# Patient Record
Sex: Female | Born: 1965 | Race: White | Hispanic: No | Marital: Married | Smoking: Current every day smoker
Health system: Southern US, Community
[De-identification: ages and names within clinical notes are randomized; demographics above are authoritative.]

---

## 2014-01-31 ENCOUNTER — Ambulatory Visit (INDEPENDENT_AMBULATORY_CARE_PROVIDER_SITE_OTHER): Payer: BC Managed Care – PPO | Admitting: Sports Medicine

## 2014-01-31 ENCOUNTER — Encounter: Payer: Self-pay | Admitting: Sports Medicine

## 2014-01-31 VITALS — BP 138/84 | HR 99 | Ht 66.0 in | Wt 245.0 lb

## 2014-01-31 DIAGNOSIS — G4719 Other hypersomnia: Secondary | ICD-10-CM | POA: Diagnosis not present

## 2014-01-31 DIAGNOSIS — E669 Obesity, unspecified: Secondary | ICD-10-CM | POA: Diagnosis not present

## 2014-01-31 DIAGNOSIS — E039 Hypothyroidism, unspecified: Secondary | ICD-10-CM | POA: Insufficient documentation

## 2014-01-31 DIAGNOSIS — Z0000 Encounter for general adult medical examination without abnormal findings: Secondary | ICD-10-CM | POA: Diagnosis not present

## 2014-01-31 DIAGNOSIS — H6122 Impacted cerumen, left ear: Secondary | ICD-10-CM | POA: Diagnosis not present

## 2014-01-31 DIAGNOSIS — E031 Congenital hypothyroidism without goiter: Secondary | ICD-10-CM | POA: Diagnosis not present

## 2014-01-31 MED ORDER — PHENTERMINE HCL 37.5 MG PO CAPS
ORAL_CAPSULE | ORAL | Status: DC
Start: 2014-01-31 — End: 2014-03-18

## 2014-01-31 NOTE — Assessment & Plan Note (Signed)
Phentermine, return monthly for weight checks and refills. 

## 2014-01-31 NOTE — Assessment & Plan Note (Signed)
Pap smear was earlier last year. Good for another 4 years.

## 2014-01-31 NOTE — Assessment & Plan Note (Signed)
Continue Cytomel and levothyroxine. If TSH, T3, T4 are in the normal range we will need to pursue a different diagnosis for her excessive daytime sleepiness.

## 2014-01-31 NOTE — Assessment & Plan Note (Signed)
We will first make sure her thyroid studies are within the normal range, if normal we need to look into sleep apnea.

## 2014-01-31 NOTE — Progress Notes (Addendum)
  Subjective:    CC: Establish care.   HPI:  This is a very pleasant 48 year old female, she presents for assistance with weight management, she has tried low-dose phentermine in the past, as well as multiple diets but is unable to cope with a voracious appetite. She has realistic expectations.  Hypothyroidism: Persistent excessive daytime sleepiness, she has been treated with Cytomel and levothyroxine, it sounds as though she had a sleep study in the distant past that was negative.  Excessive daytime sleepiness: Persistent excessive daytime sleepiness, she has been treated with Cytomel and levothyroxine, it sounds as though she had a sleep study in the distant past that was negative.  Past medical history, Surgical history, Family history not pertinant except as noted below, Social history, Allergies, and medications have been entered into the medical record, reviewed, and no changes needed.   Review of Systems: No headache, visual changes, nausea, vomiting, diarrhea, constipation, dizziness, abdominal pain, skin rash, fevers, chills, night sweats, swollen lymph nodes, weight loss, chest pain, body aches, joint swelling, muscle aches, shortness of breath, mood changes, visual or auditory hallucinations.  Objective:    General: Well Developed, well nourished, and in no acute distress.  Neuro: Alert and oriented x3, extra-ocular muscles intact, sensation grossly intact.  HEENT: Normocephalic, atraumatic, pupils equal round reactive to light, neck supple, no masses, no lymphadenopathy, thyroid nonpalpable. Impacted cerumen in the left canal. Skin: Warm and dry, no rashes noted.  Cardiac: Regular rate and rhythm, no murmurs rubs or gallops.  Respiratory: Clear to auscultation bilaterally. Not using accessory muscles, speaking in full sentences.  Abdominal: Soft, nontender, nondistended, positive bowel sounds, no masses, no organomegaly.  Musculoskeletal: Shoulder, elbow, wrist, hip, knee, ankle  stable, and with full range of motion.  Indication: Cerumen impaction of the ear(s) Medical necessity statement: On physical examination, cerumen impairs clinically significant portions of the external auditory canal, and tympanic membrane. Noted obstructive, copious cerumen that cannot be removed without magnification and instrumentations requiring physician skills Consent: Discussed benefits and risks of procedure and verbal consent obtained Procedure: Patient was prepped for the procedure. Utilized an otoscope to assess and take note of the ear canal, the tympanic membrane, and the presence, amount, and placement of the cerumen. Gentle water irrigation and soft plastic curette was utilized to remove cerumen.  Post procedure examination: shows cerumen was completely removed. Patient tolerated procedure well. The patient is made aware that they may experience temporary vertigo, temporary hearing loss, and temporary discomfort. If these symptom last for more than 24 hours to call the clinic or proceed to the ED.  Impression and Recommendations:    The patient was counselled, risk factors were discussed, anticipatory guidance given.

## 2014-02-07 LAB — COMPREHENSIVE METABOLIC PANEL WITH GFR
Albumin: 3.7 g/dL (ref 3.5–5.2)
BUN: 8 mg/dL (ref 6–23)
Chloride: 105 meq/L (ref 96–112)
Creat: 0.63 mg/dL (ref 0.50–1.10)
Glucose, Bld: 98 mg/dL (ref 70–99)
Potassium: 4.3 meq/L (ref 3.5–5.3)

## 2014-02-07 LAB — CBC
HCT: 36.2 % (ref 36.0–46.0)
Hemoglobin: 11.8 g/dL — ABNORMAL LOW (ref 12.0–15.0)
MCH: 28.1 pg (ref 26.0–34.0)
MCHC: 32.6 g/dL (ref 30.0–36.0)
MCV: 86.2 fL (ref 78.0–100.0)
Platelets: 382 10*3/uL (ref 150–400)
RBC: 4.2 MIL/uL (ref 3.87–5.11)
RDW: 14.6 % (ref 11.5–15.5)
WBC: 7.5 K/uL (ref 4.0–10.5)

## 2014-02-07 LAB — TSH: TSH: 1.277 u[IU]/mL (ref 0.350–4.500)

## 2014-02-07 LAB — LIPID PANEL
Cholesterol: 186 mg/dL (ref 0–200)
HDL: 55 mg/dL (ref >39)
LDL Cholesterol: 115 mg/dL — ABNORMAL HIGH (ref 0–99)
Total CHOL/HDL Ratio: 3.4 Ratio
Triglycerides: 82 mg/dL (ref <150)
VLDL: 16 mg/dL (ref 0–40)

## 2014-02-07 LAB — COMPREHENSIVE METABOLIC PANEL
ALT: 16 U/L (ref 0–35)
AST: 13 U/L (ref 0–37)
Alkaline Phosphatase: 101 U/L (ref 39–117)
CO2: 25 mEq/L (ref 19–32)
Calcium: 9.1 mg/dL (ref 8.4–10.5)
Sodium: 141 mEq/L (ref 135–145)
Total Bilirubin: 0.2 mg/dL (ref 0.2–1.2)
Total Protein: 6.5 g/dL (ref 6.0–8.3)

## 2014-02-07 LAB — T3, FREE: T3, Free: 2.6 pg/mL (ref 2.3–4.2)

## 2014-02-07 LAB — T4, FREE: Free T4: 1.18 ng/dL (ref 0.80–1.80)

## 2014-02-10 ENCOUNTER — Encounter: Payer: Self-pay | Admitting: Sports Medicine

## 2014-03-03 ENCOUNTER — Other Ambulatory Visit: Payer: Self-pay | Admitting: Sports Medicine

## 2014-03-03 ENCOUNTER — Ambulatory Visit: Payer: BC Managed Care – PPO | Admitting: Sports Medicine

## 2014-03-04 ENCOUNTER — Ambulatory Visit: Payer: BC Managed Care – PPO | Admitting: Sports Medicine

## 2014-03-10 ENCOUNTER — Ambulatory Visit: Payer: BC Managed Care – PPO | Admitting: Sports Medicine

## 2014-03-17 ENCOUNTER — Ambulatory Visit: Payer: BC Managed Care – PPO | Admitting: Sports Medicine

## 2014-03-18 ENCOUNTER — Ambulatory Visit (INDEPENDENT_AMBULATORY_CARE_PROVIDER_SITE_OTHER): Payer: BC Managed Care – PPO | Admitting: Sports Medicine

## 2014-03-18 ENCOUNTER — Encounter: Payer: Self-pay | Admitting: Sports Medicine

## 2014-03-18 VITALS — BP 133/81 | HR 99 | Ht 66.0 in | Wt 246.0 lb

## 2014-03-18 DIAGNOSIS — E031 Congenital hypothyroidism without goiter: Secondary | ICD-10-CM

## 2014-03-18 DIAGNOSIS — E669 Obesity, unspecified: Secondary | ICD-10-CM

## 2014-03-18 DIAGNOSIS — G4719 Other hypersomnia: Secondary | ICD-10-CM

## 2014-03-18 MED ORDER — NALTREXONE-BUPROPION HCL ER 8-90 MG PO TB12: ORAL_TABLET | ORAL | Status: DC

## 2014-03-18 NOTE — Assessment & Plan Note (Addendum)
Insufficient weight loss on phentermine. Nutrition referral. Switching to Contrave.

## 2014-03-18 NOTE — Assessment & Plan Note (Signed)
Well-controlled on current medications 

## 2014-03-18 NOTE — Progress Notes (Signed)
  Subjective:    CC: Follow-up  HPI: Obesity: Missed 2 weeks of medication, has not lost any weight on phentermine.  Fatigue/daytime sleepiness: TSH, T3, T4 are normal, she denies any symptoms of depression. She has had a sleep study which was negative. She is perimenopausal with periods becoming irregular now.  Past medical history, Surgical history, Family history not pertinant except as noted below, Social history, Allergies, and medications have been entered into the medical record, reviewed, and no changes needed.   Review of Systems: No fevers, chills, night sweats, weight loss, chest pain, or shortness of breath.   Objective:    General: Well Developed, well nourished, and in no acute distress.  Neuro: Alert and oriented x3, extra-ocular muscles intact, sensation grossly intact.  HEENT: Normocephalic, atraumatic, pupils equal round reactive to light, neck supple, no masses, no lymphadenopathy, thyroid nonpalpable.  Skin: Warm and dry, no rashes. Cardiac: Regular rate and rhythm, no murmurs rubs or gallops, no lower extremity edema.  Respiratory: Clear to auscultation bilaterally. Not using accessory muscles, speaking in full sentences.  Impression and Recommendations:

## 2014-03-18 NOTE — Assessment & Plan Note (Addendum)
Sleeps well through the night. This point away from sleep apnea. She's also had sleep studies in the past that were negative. Thyroid function is normal pointing away from hypothyroidism. Mood is okay. I hope that we get some improvement with adding Contrave for weight loss. If not certainly we can consider Provigil or Nuvigil. Last resort, would be referral to a sleep specialist.  We are going to take FSH, LH, progesterone, and estrogen levels today.  I do suspect there is an element of perimenopause.

## 2014-03-19 LAB — FOLLICLE STIMULATING HORMONE: FSH: 9.3 m[IU]/mL

## 2014-03-19 LAB — PROLACTIN: Prolactin: 6.4 ng/mL

## 2014-03-19 LAB — LUTEINIZING HORMONE: LH: 6.4 m[IU]/mL

## 2014-03-19 LAB — PROGESTERONE: Progesterone: 0.4 ng/mL

## 2014-03-27 LAB — ESTROGENS, TOTAL: Estrogen: 290 pg/mL

## 2014-04-01 ENCOUNTER — Encounter: Payer: Self-pay | Admitting: Sports Medicine

## 2014-04-01 DIAGNOSIS — E669 Obesity, unspecified: Secondary | ICD-10-CM

## 2014-04-02 MED ORDER — PHENTERMINE HCL 37.5 MG PO CAPS: ORAL_CAPSULE | ORAL | Status: DC

## 2014-04-02 MED ORDER — TOPIRAMATE 50 MG PO TABS: ORAL_TABLET | ORAL | Status: DC

## 2014-04-02 NOTE — Assessment & Plan Note (Signed)
Unable to pickup, Contrave. Per patient request we will restart phentermine with the addition of Topamax for a one-month trial.

## 2014-04-03 ENCOUNTER — Other Ambulatory Visit: Payer: Self-pay

## 2014-04-03 MED ORDER — PHENTERMINE HCL 37.5 MG PO CAPS: ORAL_CAPSULE | ORAL | Status: DC

## 2014-04-03 NOTE — Telephone Encounter (Signed)
Rx was reprinted because the other Rx was not found. Rhonda Cunningham,CMA

## 2014-04-21 ENCOUNTER — Ambulatory Visit: Payer: BC Managed Care – PPO | Admitting: Sports Medicine

## 2014-05-02 ENCOUNTER — Encounter: Payer: Self-pay | Admitting: Sports Medicine

## 2014-05-02 ENCOUNTER — Ambulatory Visit (INDEPENDENT_AMBULATORY_CARE_PROVIDER_SITE_OTHER): Payer: Self-pay | Admitting: Sports Medicine

## 2014-05-02 DIAGNOSIS — E669 Obesity, unspecified: Secondary | ICD-10-CM

## 2014-05-02 MED ORDER — NYSTATIN 100000 UNIT/ML MT SUSP: 500000.0000 [IU] | Freq: Four times a day (QID) | OROMUCOSAL | Status: DC

## 2014-05-02 MED ORDER — PHENTERMINE HCL 37.5 MG PO CAPS: ORAL_CAPSULE | ORAL | Status: DC

## 2014-05-02 MED ORDER — TOPIRAMATE 100 MG PO TABS: 100.0000 mg | ORAL_TABLET | Freq: Every day | ORAL | Status: DC

## 2014-05-02 NOTE — Progress Notes (Signed)
  Subjective:    CC: weight check  HPI: Obesity: Jenny Jennings did finally lose some weight, we are giving her a second chance on phentermine, and added Topamax this time.  Past medical history, Surgical history, Family history not pertinant except as noted below, Social history, Allergies, and medications have been entered into the medical record, reviewed, and no changes needed.   Review of Systems: No fevers, chills, night sweats, weight loss, chest pain, or shortness of breath.   Objective:    General: Well Developed, well nourished, and in no acute distress.  Neuro: Alert and oriented x3, extra-ocular muscles intact, sensation grossly intact.  HEENT: Normocephalic, atraumatic, pupils equal round reactive to light, neck supple, no masses, no lymphadenopathy, thyroid nonpalpable.  Skin: Warm and dry, no rashes. Cardiac: Regular rate and rhythm, no murmurs rubs or gallops, no lower extremity edema.  Respiratory: Clear to auscultation bilaterally. Not using accessory muscles, speaking in full sentences.  Impression and Recommendations:

## 2014-05-02 NOTE — Assessment & Plan Note (Addendum)
5 pound weight loss on phentermine and Topamax. Next line refilling medication. She does have some burning in her mouth is likely due to dry mouth.  she is also seeing a nutritionist. She is concerned that there is thrush however I don't see anything, I'm going to give her a nystatin gargle for now. She will touch base with her primary doctor regarding this.

## 2014-05-30 ENCOUNTER — Encounter: Payer: Self-pay | Admitting: Sports Medicine

## 2014-05-30 ENCOUNTER — Ambulatory Visit: Payer: BLUE CROSS/BLUE SHIELD | Admitting: Sports Medicine

## 2017-12-12 ENCOUNTER — Encounter (HOSPITAL_COMMUNITY): Payer: Self-pay | Admitting: Emergency Medicine

## 2017-12-12 ENCOUNTER — Emergency Department (HOSPITAL_COMMUNITY)
Admission: EM | Admit: 2017-12-12 | Discharge: 2017-12-13 | Disposition: A | Discharge status: Other Acute Inpatient Hospital | Payer: BLUE CROSS/BLUE SHIELD | Attending: Emergency Medicine | Admitting: Emergency Medicine

## 2017-12-12 DIAGNOSIS — Z720 Tobacco use: Secondary | ICD-10-CM | POA: Insufficient documentation

## 2017-12-12 DIAGNOSIS — F329 Major depressive disorder, single episode, unspecified: Secondary | ICD-10-CM | POA: Insufficient documentation

## 2017-12-12 DIAGNOSIS — F411 Generalized anxiety disorder: Secondary | ICD-10-CM | POA: Insufficient documentation

## 2017-12-12 DIAGNOSIS — F419 Anxiety disorder, unspecified: Secondary | ICD-10-CM | POA: Insufficient documentation

## 2017-12-12 DIAGNOSIS — E039 Hypothyroidism, unspecified: Secondary | ICD-10-CM | POA: Insufficient documentation

## 2017-12-12 DIAGNOSIS — R4689 Other symptoms and signs involving appearance and behavior: Secondary | ICD-10-CM

## 2017-12-12 LAB — CBC
HCT: 38.8 % (ref 36.0–46.0)
Hemoglobin: 12.7 g/dL (ref 12.0–15.0)
MCH: 28.6 pg (ref 26.0–34.0)
MCHC: 32.7 g/dL (ref 30.0–36.0)
MCV: 87.4 fL (ref 78.0–100.0)
PLATELETS: 371 10*3/uL (ref 150–400)
RBC: 4.44 MIL/uL (ref 3.87–5.11)
RDW: 13.6 % (ref 11.5–15.5)
WBC: 11.3 10*3/uL — AB (ref 4.0–10.5)

## 2017-12-12 LAB — SALICYLATE LEVEL: Salicylate Lvl: <7 mg/dL (ref 2.8–30.0)

## 2017-12-12 LAB — COMPREHENSIVE METABOLIC PANEL
ALBUMIN: 3.9 g/dL (ref 3.5–5.0)
ALT: 53 U/L — ABNORMAL HIGH (ref 0–44)
AST: 36 U/L (ref 15–41)
Alkaline Phosphatase: 94 U/L (ref 38–126)
Anion gap: 12 (ref 5–15)
BUN: 5 mg/dL — AB (ref 6–20)
CHLORIDE: 106 mmol/L (ref 98–111)
CO2: 21 mmol/L — ABNORMAL LOW (ref 22–32)
Calcium: 9.4 mg/dL (ref 8.9–10.3)
Creatinine, Ser: 0.83 mg/dL (ref 0.44–1.00)
GFR calc Af Amer: >60 mL/min (ref >60)
Glucose, Bld: 121 mg/dL — ABNORMAL HIGH (ref 70–99)
POTASSIUM: 3.1 mmol/L — AB (ref 3.5–5.1)
Sodium: 139 mmol/L (ref 135–145)
Total Bilirubin: 0.5 mg/dL (ref 0.3–1.2)
Total Protein: 6.9 g/dL (ref 6.5–8.1)

## 2017-12-12 LAB — ACETAMINOPHEN LEVEL: Acetaminophen (Tylenol), Serum: <10 ug/mL — ABNORMAL LOW (ref 10–30)

## 2017-12-12 LAB — ETHANOL

## 2017-12-12 NOTE — ED Triage Notes (Signed)
Pt presents with family members stating she has been altered X few days. Per family pt has not ate, drank, or slept. Pt appears to be manic, has flight of ideas. Per family no hx of bipolar disorder, only as hx of anxiety and depression.

## 2017-12-12 NOTE — ED Provider Notes (Signed)
[facility] [place] EMERGENCY DEPARTMENT Provider Note   CSN: [id number] Arrival date & time: 12/12/17  2204     History   Chief Complaint Chief Complaint  Patient presents with  . Altered Mental Status    HPI Jenny Jennings is a 52 y.o. female.  The history is provided by the patient and medical records.  Altered Mental Status       52 y.o. F with hx of obesity, hypothyroidism, presenting to the ED for acute behavioral changes.  Family reports they first started noticing this on Saturday, has steadily worsened since that time.  Family reports since Saturday she has not really ate, drank fluids, or slept more than 30 minutes at a time.  States she has been up pacing around the house, constantly fidgeting, and has had a very labile mood.  States she has been somewhat verbally aggressive at times, other times laughing inappropriately.  She has not had any focal numbness or weakness, no difficulty walking, no slurred speech.  No real medical concerns, had a full physical over the summer and everything seemed to be going normal.  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  Husband reports she seemed to be under a lot of stress about this, primary care doctor did recently increase her anxiety medicine to accommodate for this.  There have been no other changes in medications.  She has not had any fevers or other systemic symptoms.  Husband denies any use of alcohol or illicit drugs.  History reviewed. No pertinent past medical history.  Patient Active Problem List   Diagnosis Date Noted  . Obesity 01/31/2014  . Excessive daytime sleepiness 01/31/2014  . Hypothyroidism 01/31/2014  . Annual physical exam 01/31/2014    History reviewed. No pertinent surgical history.   OB History   None      Home Medications    Prior to  Admission medications   Medication Sig Start Date End Date Taking? Authorizing Provider  levothyroxine (SYNTHROID, LEVOTHROID) 137 MCG tablet Take 137 mcg by mouth daily before breakfast.    [provider]  liothyronine (CYTOMEL) 25 MCG tablet Take by mouth daily.    [provider]  nystatin (MYCOSTATIN) 100000 UNIT/ML suspension Take 5 mLs (500,000 Units total) by mouth 4 (four) times daily. Swish for 30 seconds and spit out. 05/02/14   Monica Becton, MD  phentermine 37.5 MG capsule One capsule by mouth at 11 AM 05/02/14   Monica Becton, MD  pregabalin (LYRICA) 75 MG capsule Take 75 mg by mouth 2 (two) times daily.    [provider]  topiramate (TOPAMAX) 100 MG tablet Take 1 tablet (100 mg total) by mouth daily. 05/02/14   Monica Becton, MD    Family History No family history on file.  Social History Social History   Tobacco Use  . Smoking status: Smoker, Current Status Unknown  . Smokeless tobacco: Never Used  Substance Use Topics  . Alcohol use: Not Currently  . Drug use: Not Currently     Allergies   Patient has no known allergies.   Review of Systems Review of Systems  Psychiatric/Behavioral: Positive for behavioral problems.  All other systems reviewed and are negative.    Physical Exam Updated Vital Signs BP 103/80 (BP Location: Right Arm)   Pulse 72   Temp 98.3 F (36.8 C) (  Oral)   Resp 18   SpO2 98%   Physical Exam  Constitutional: She appears well-developed and well-nourished.  HENT:  Head: Normocephalic and atraumatic.  Mouth/Throat: Oropharynx is clear and moist.  Eyes: Pupils are equal, round, and reactive to light. Conjunctivae and EOM are normal.  Neck: Normal range of motion.  Cardiovascular: Normal rate, regular rhythm and normal heart sounds.  Pulmonary/Chest: Effort normal and breath sounds normal. No stridor. No respiratory distress.  Abdominal: Soft. Bowel sounds are normal. There is no  tenderness. There is no rebound.  Musculoskeletal: Normal range of motion.  Neurological: She is alert.  Awake, alert, moving arms and legs well without issue, ambulatory with steady gait  Skin: Skin is warm and dry.  Psychiatric:  Somewhat bizarre behavior, laughing inappropriately at times, randomly yelling words with some tangential thoughts during exam  Nursing note and vitals reviewed.    ED Treatments / Results  Labs (all labs ordered are listed, but only abnormal results are displayed) Labs Reviewed  COMPREHENSIVE METABOLIC PANEL - Abnormal; Notable for the following components:      Result Value   Potassium 3.1 (*)    CO2 21 (*)    Glucose, Bld 121 (*)    BUN 5 (*)    ALT 53 (*)    All other components within normal limits  ACETAMINOPHEN LEVEL - Abnormal; Notable for the following components:   Acetaminophen (Tylenol), Serum <10 (*)    All other components within normal limits  CBC - Abnormal; Notable for the following components:   WBC 11.3 (*)    All other components within normal limits  RAPID URINE DRUG SCREEN, HOSP PERFORMED - Abnormal; Notable for the following components:   Benzodiazepines POSITIVE (*)    Amphetamines POSITIVE (*)    Tetrahydrocannabinol POSITIVE (*)    All other components within normal limits  ETHANOL  SALICYLATE LEVEL  I-STAT BETA HCG BLOOD, ED ([facility], [facility], [facility] ONLY)  POC URINE PREG, ED    EKG None  Radiology No results found.  Procedures Procedures (including critical care time)  Medications Ordered in ED Medications - No data to display   Initial Impression / Assessment and Plan / ED Course  I have reviewed the triage vital signs and the nursing notes.  Pertinent labs & imaging results that were available during my care of the patient were reviewed by me and considered in my medical decision making (see chart for details).  52 year old now brought in by family for behavioral changes.  Recently found out her mother was diagnosed  with Alzheimer's and patient is having to quit her job, has had a steady decline since then-- not sleeping or eating, up pacing all night, some outbursts witnessed.  On exam here her behavior is somewhat bizarre, laughing inappropriately during conversation, intermittently yelling words, thoughts are somewhat tangential.  Does have history of anxiety and depression, recent adjustments to some of her psychiatric medications.  Screening labs are overall reassuring.  She does not have any fever or other systemic symptoms of illness.  She does not have any focal neurologic deficits that I can appreciate on exam.  I do suspect that this is likely psychiatric in origin.  TTS consult ordered.  TTS has evaluated, recommends inpatient placement.  Have attempted to contact patient's pharmacy to order home meds, however not open right now and family unsure of dosages.  Pharmacy will be contacted in the morning and will update medication list when available.  Final Clinical Impressions(s) /  ED Diagnoses   Final diagnoses:  Behavioral change    ED Discharge Orders    None       Garlon Hatchet, PA-C 12/13/17 0601    Lorre Nick, MD 12/13/17 [phone]

## 2017-12-13 ENCOUNTER — Encounter (HOSPITAL_COMMUNITY): Payer: Self-pay | Admitting: Emergency Medicine

## 2017-12-13 ENCOUNTER — Emergency Department (HOSPITAL_COMMUNITY): Payer: BLUE CROSS/BLUE SHIELD

## 2017-12-13 ENCOUNTER — Inpatient Hospital Stay (HOSPITAL_COMMUNITY)
Admission: AD | Admit: 2017-12-13 | Discharge: 2017-12-18 | DRG: 885 | Disposition: A | Discharge status: Home / Self Care | Payer: BLUE CROSS/BLUE SHIELD | Source: Intra-hospital | Attending: Psychiatry | Admitting: Psychiatry

## 2017-12-13 DIAGNOSIS — F302 Manic episode, severe with psychotic symptoms: Principal | ICD-10-CM | POA: Diagnosis present

## 2017-12-13 DIAGNOSIS — G4700 Insomnia, unspecified: Secondary | ICD-10-CM | POA: Diagnosis present

## 2017-12-13 DIAGNOSIS — F22 Delusional disorders: Secondary | ICD-10-CM | POA: Diagnosis present

## 2017-12-13 DIAGNOSIS — M797 Fibromyalgia: Secondary | ICD-10-CM | POA: Diagnosis present

## 2017-12-13 DIAGNOSIS — F411 Generalized anxiety disorder: Secondary | ICD-10-CM | POA: Diagnosis not present

## 2017-12-13 DIAGNOSIS — Z6833 Body mass index (BMI) 33.0-33.9, adult: Secondary | ICD-10-CM

## 2017-12-13 DIAGNOSIS — F17200 Nicotine dependence, unspecified, uncomplicated: Secondary | ICD-10-CM | POA: Diagnosis present

## 2017-12-13 DIAGNOSIS — F909 Attention-deficit hyperactivity disorder, unspecified type: Secondary | ICD-10-CM | POA: Diagnosis present

## 2017-12-13 DIAGNOSIS — E039 Hypothyroidism, unspecified: Secondary | ICD-10-CM | POA: Diagnosis present

## 2017-12-13 DIAGNOSIS — F419 Anxiety disorder, unspecified: Secondary | ICD-10-CM | POA: Diagnosis present

## 2017-12-13 DIAGNOSIS — F1290 Cannabis use, unspecified, uncomplicated: Secondary | ICD-10-CM | POA: Diagnosis not present

## 2017-12-13 DIAGNOSIS — R451 Restlessness and agitation: Secondary | ICD-10-CM | POA: Diagnosis not present

## 2017-12-13 DIAGNOSIS — F29 Unspecified psychosis not due to a substance or known physiological condition: Secondary | ICD-10-CM | POA: Diagnosis present

## 2017-12-13 DIAGNOSIS — F17210 Nicotine dependence, cigarettes, uncomplicated: Secondary | ICD-10-CM | POA: Diagnosis not present

## 2017-12-13 DIAGNOSIS — E669 Obesity, unspecified: Secondary | ICD-10-CM | POA: Diagnosis present

## 2017-12-13 LAB — RAPID URINE DRUG SCREEN, HOSP PERFORMED
AMPHETAMINES: POSITIVE — AB
BENZODIAZEPINES: POSITIVE — AB
Barbiturates: NOT DETECTED
COCAINE: NOT DETECTED
OPIATES: NOT DETECTED
TETRAHYDROCANNABINOL: POSITIVE — AB

## 2017-12-13 LAB — POC URINE PREG, ED: Preg Test, Ur: NEGATIVE

## 2017-12-13 MED ORDER — ALUM & MAG HYDROXIDE-SIMETH 200-200-20 MG/5ML PO SUSP: 30.0000 mL | Freq: Four times a day (QID) | ORAL | Status: DC | PRN

## 2017-12-13 MED ORDER — LORAZEPAM 1 MG PO TABS
1.0000 mg | ORAL_TABLET | Freq: Once | ORAL | Status: AC
  Administered 2017-12-13: 1 mg via ORAL
  Filled 2017-12-13: qty 1

## 2017-12-13 MED ORDER — FLUOXETINE HCL 20 MG PO CAPS: 40.0000 mg | ORAL_CAPSULE | Freq: Every day | ORAL | Status: DC

## 2017-12-13 MED ORDER — THYROID 60 MG PO TABS
65.0000 mg | ORAL_TABLET | Freq: Every day | ORAL | Status: DC
  Administered 2017-12-13: 60 mg via ORAL
  Filled 2017-12-13: qty 1

## 2017-12-13 MED ORDER — ONDANSETRON HCL 4 MG PO TABS: 4.0000 mg | ORAL_TABLET | Freq: Three times a day (TID) | ORAL | Status: DC | PRN

## 2017-12-13 MED ORDER — OLANZAPINE 5 MG PO TBDP
5.0000 mg | ORAL_TABLET | Freq: Once | ORAL | Status: AC
  Administered 2017-12-13: 5 mg via ORAL
  Filled 2017-12-13: qty 1

## 2017-12-13 MED ORDER — ZOLPIDEM TARTRATE 5 MG PO TABS: 5.0000 mg | ORAL_TABLET | Freq: Every evening | ORAL | Status: DC | PRN

## 2017-12-13 MED ORDER — ZIPRASIDONE MESYLATE 20 MG IM SOLR
20.0000 mg | Freq: Once | INTRAMUSCULAR | Status: AC
  Administered 2017-12-13: 20 mg via INTRAMUSCULAR
  Filled 2017-12-13: qty 20

## 2017-12-13 MED ORDER — LISDEXAMFETAMINE DIMESYLATE 20 MG PO CAPS: 40.0000 mg | ORAL_CAPSULE | Freq: Every day | ORAL | Status: DC

## 2017-12-13 MED ORDER — STERILE WATER FOR INJECTION IJ SOLN
INTRAMUSCULAR | Status: AC
  Filled 2017-12-13: qty 10

## 2017-12-13 MED ORDER — IBUPROFEN 800 MG PO TABS: 800.0000 mg | ORAL_TABLET | Freq: Four times a day (QID) | ORAL | Status: DC | PRN

## 2017-12-13 MED ORDER — ACETAMINOPHEN 325 MG PO TABS: 650.0000 mg | ORAL_TABLET | ORAL | Status: DC | PRN

## 2017-12-13 MED ORDER — DIPHENHYDRAMINE HCL 50 MG/ML IJ SOLN
50.0000 mg | Freq: Once | INTRAMUSCULAR | Status: AC
  Administered 2017-12-13: 50 mg via INTRAMUSCULAR
  Filled 2017-12-13: qty 1

## 2017-12-13 NOTE — BH Assessment (Signed)
Tele Assessment Note   Patient Name: Jenny Jennings MRN: [id number] Referring Physician: Sharilyn Sites, PA-C Location of Patient: [facility] Location of Provider: Behavioral Health TTS Department  Jenny Jennings is an 52 y.o. female who was brought to [facility] by her husband and her friend due to ongoing strange behaviors. Pt's husband and friend provided the information for this assessment, as pt appeared to be experiencing a psychotic break and was not engaging in conversation, only saying words and making statements at times. Pt's husband shares pt began acting "off" Saturday evening; he states this got worse on Sunday and still worse on Monday, and when it did not get better today he brought her to the hospital. Pt's husband shares pt has been under much stress, including a large increase in stress due to her mother having Alzheimer's and pt leaving her job to help take care of her parents.  Pt's husband denies pt uses drugs. He states pt has been stressed before but she has never had this type of incident. He states he does not believe pt has slept since Saturday, with the exception of 20 minute intervals "here and there." Pt's husband states pt has been prescribed Xanax for her anxiety and that she also takes medication to assist with her depression, though he cannot remember what she takes. Pt's friend stated she and pt's husband went through pt's medication and reviewed the pill bottles to determine when they were filled and how much medication should be left in each of them; she states that, according to what they found, the amount of pills in each bottle appeared to be correct. Pt's husband denies any previous hospitalizations or suicide attempts.  Pt was engaged in EMDR therapy for 4-6 months with Jenny Jennings 8-10 months ago. She is not currently seeing a therapist nor has she ever seen a psychiatrist. Pt's husband states that, when pt is depressed, she is irritable and tearful. Pt's husband  states he hadn't noticed anything different about pt's sleep prior to Saturday and that had also been eating regularly. Pt was adopted, so some information about her biological family is unknown.  Pt's orientation was unable to be assessed, as was her memory. Pt's husband and her friend state pt can follow directions, though she cannot actively communicate. Pt did not participate in this assessment. Pt's insight, judgement, and impulse control was not able to be determined.   Diagnosis: F41.1, Generalized anxiety disorder   Past Medical History: History reviewed. No pertinent past medical history.  History reviewed. No pertinent surgical history.  Family History: No family history on file.  Social History:  reports that she has been smoking. She has never used smokeless tobacco. She reports that she drank alcohol. She reports that she has current or past drug history.  Additional Social History:  Alcohol / Drug Use Pain Medications: Please see MAR Prescriptions: Please see MAR Over the Counter: Please see MAR History of alcohol / drug use?: No history of alcohol / drug abuse Longest period of sobriety (when/how long): Please see MAR  CIWA: CIWA-Ar BP: 103/80 Pulse Rate: 72 COWS:    Allergies: No Known Allergies  Home Medications:  (Not in a hospital admission)  OB/GYN Status:  No LMP recorded.  General Assessment Data Location of Assessment: [facility] ED TTS Assessment: In system Is this a Tele or Face-to-Face Assessment?: Face-to-Face Is this an Initial Assessment or a Re-assessment for this encounter?: Initial Assessment Patient Accompanied by:: Other(Pt is accompanied by her husband and her  co-worker friend) Counsellor Other than English: No Living Arrangements: Other (Comment)(Pt lives with her husband and children) What gender do you identify as?: Female Marital status: Married Artist name: Jenny Jennings Pregnancy Status: No Living Arrangements: Spouse/significant other,  Children Can pt return to current living arrangement?: Yes Admission Status: Voluntary Is patient capable of signing voluntary admission?: No Referral Source: Self/Family/Friend Insurance type: BCBS     Crisis Care Plan Living Arrangements: Spouse/significant other, Children Name of Psychiatrist: None Name of Therapist: None  Education Status Is patient currently in school?: No Is the patient employed, unemployed or receiving disability?: Employed  Risk to self with the past 6 months Suicidal Ideation: (UTA) Has patient been a risk to self within the past 6 months prior to admission? : (UTA) Suicidal Intent: (UTA) Has patient had any suicidal intent within the past 6 months prior to admission? : (UTA) Is patient at risk for suicide?: (UTA) Suicidal Plan?: (UTA) Has patient had any suicidal plan within the past 6 months prior to admission? : (UTA) Access to Means: (UTA) What has been your use of drugs/alcohol within the last 12 months?: Pt's husband denies; pt's UDA negative Previous Attempts/Gestures: No How many times?: 0 Other Self Harm Risks: None noted Triggers for Past Attempts: None known Intentional Self Injurious Behavior: None(None noted by pt's husband) Family Suicide History: Unable to assess(Pt's husband shared pt is adopted) Recent stressful life event(s): Loss (Comment), Turmoil (Comment)(Pt is leaving her job, her mother has Alzheimer's) Persecutory voices/beliefs?: [name]) Depression: (UTA) Depression Symptoms: (UTA) Substance abuse history and/or treatment for substance abuse?: No Suicide prevention information given to non-admitted patients: Not applicable  Risk to Others within the past 6 months Homicidal Ideation: (UTA) Does patient have any lifetime risk of violence toward others beyond the six months prior to admission? : No Thoughts of Harm to Others: (UTA) Current Homicidal Intent: (UTA) Current Homicidal Plan: (UTA) Access to Homicidal Means:  (UTA) Identified Victim: None noted, though pt is essentially non-verbal/word salad History of harm to others?: No Assessment of Violence: On admission Violent Behavior Description: None noted by pt's husband Does patient have access to weapons?: (UTA) Criminal Charges Pending?: No Does patient have a court date: No Is patient on probation?: No  Psychosis Hallucinations: (UTA) Delusions: (UTA)  Mental Status Report Appearance/Hygiene: In scrubs Eye Contact: Poor Motor Activity: Mannerisms, Restlessness, Hyperactivity Speech: Word salad, Tangential(Pt is experiencing a psychotic break) Level of Consciousness: Quiet/awake, Restless, Other (Comment)(Pt is experiencing a psychotic break) Mood: Anxious, Sullen, Fearful(Pt is experiencing a psychotic break) Affect: Apprehensive(Pt is experiencing a psychotic break) Anxiety Level: Moderate Thought Processes: Unable to Assess(Pt is experiencing a psychotic break) Judgement: Unable to Assess(Pt is experiencing a psychotic break) Orientation: Unable to assess(Pt is experiencing a psychotic break) Obsessive Compulsive Thoughts/Behaviors: Unable to Assess(Pt is experiencing a psychotic break)  Cognitive Functioning Concentration: Unable to Assess(Pt is experiencing a psychotic break) Memory: Unable to Assess(Pt is experiencing a psychotic break) Is patient IDD: No Insight: Unable to Assess(Pt is experiencing a psychotic break) Impulse Control: Unable to Assess(Pt is experiencing a psychotic break) Appetite: Fair Have you had any weight changes? : No Change Sleep: Unable to Assess(Unable to det. how much pt has slept, do not think much) Total Hours of Sleep: (UTA) Vegetative Symptoms: Unable to Assess(Pt is experiencing a psychotic break)  ADLScreening [facility] Assessment Services) Patient's cognitive ability adequate to safely complete daily activities?: No Patient able to express need for assistance with ADLs?: No Independently performs  ADLs?: No  Prior Inpatient  Therapy Prior Inpatient Therapy: No  Prior Outpatient Therapy Prior Outpatient Therapy: Yes Prior Therapy Dates: Late 2018 - Early 2019 Prior Therapy Facilty/Provider(s): Jenny Jennings Reason for Treatment: Provided EMDR Does patient have an ACCT team?: No Does patient have Intensive In-House Services?  : No Does patient have [facility] services? : No Does patient have P4CC services?: No  ADL Screening (condition at time of admission) Patient's cognitive ability adequate to safely complete daily activities?: No Is the patient deaf or have difficulty hearing?: No Does the patient have difficulty seeing, even when wearing glasses/contacts?: No Does the patient have difficulty concentrating, remembering, or making decisions?: Yes Patient able to express need for assistance with ADLs?: No Does the patient have difficulty dressing or bathing?: No Independently performs ADLs?: No Does the patient have difficulty walking or climbing stairs?: No Weakness of Legs: None Weakness of Arms/Hands: None     Therapy Consults (therapy consults require a physician order) PT Evaluation Needed: No OT Evalulation Needed: No SLP Evaluation Needed: No Abuse/Neglect Assessment (Assessment to be complete while patient is alone) Abuse/Neglect Assessment Can Be Completed: Unable to assess, patient is non-responsive or altered mental status(Unable to complete, as pt is experiencing a psychotic break) Values / Beliefs Cultural Requests During Hospitalization: None Spiritual Requests During Hospitalization: None Consults Spiritual Care Consult Needed: No Social Work Consult Needed: No Merchant navy officer (For Healthcare) Does Patient Have a Medical Advance Directive?: (Unable to complete, as pt is experiencing a psychotic break) Would patient like information on creating a medical advance directive?: (Unable to complete, as pt is experiencing a psychotic break)        Disposition: Nira Conn NP reviewed pt's chart and notes and determined that pt meets criteria for inpatient hospitalization. Pt's information will be provided to [facility] and will be faxed out to multiple hospitals for potential placement. This information was provided to pt's nurse, Gabriel Rung RN, at [phone].   Disposition Initial Assessment Completed for this Encounter: Yes Patient referred to: Other (Comment)(Pt will be referred to [facility] and other hospitals)  This service was provided via telemedicine using a 2-way, interactive audio and video technology.  Names of all persons participating in this telemedicine service and their role in this encounter. Name: Taylynn Easton (did not participate) Role: Patient  Name: Jose Alleyne Role: Patient's Husband  Name: Rosario Jacks Role: Patient's Friend  Name: Duard Brady Role: Clinician    Ralph Dowdy 12/13/2017 3:16 AM

## 2017-12-13 NOTE — ED Notes (Signed)
TTS in process 

## 2017-12-13 NOTE — ED Triage Notes (Signed)
Pt awake in bed sitting up snapping fingers at staff. Pt turned in bed with her head at foot of bed and continued snapping fingers at staff. Pt not speaking . Pt alert  No distress noted. Pt at room door looking at bath room . Pt pointed to BR Pt indicated needing to go to BR . Pt ambulatory to BR ,closed door and pulled alarm in BR when  Finished. Pt escorted bac to her room.

## 2017-12-13 NOTE — ED Notes (Addendum)
Pt awake, speaking anxiously and beginning repetitive speech but redirectable. TV and light off in room, sitting up calmly at this time.

## 2017-12-13 NOTE — ED Provider Notes (Signed)
Blood pressure 103/68, pulse 68, temperature 97.9 F (36.6 C), temperature source Oral, resp. rate 18, SpO2 98 %.  In short, Jenny Jennings is a 52 y.o. female with a chief complaint of Altered Mental Status .  Refer to the original H&P for additional details.  12:16 PM Patient became agitated and ran from the ED. She had to be brought back and restrained. She is accepted to behavioral health. Completed IVC.   01:17 PM Updated husband at bedside. Re-ordered home meds including thyroid medication. Continue to monitor.   CRITICAL CARE Performed by: Maia Plan Total critical care time: 35 minutes Critical care time was exclusive of separately billable procedures and treating other patients. Critical care was necessary to treat or prevent imminent or life-threatening deterioration. Critical care was time spent personally by me on the following activities: development of treatment plan with patient and/or surrogate as well as nursing, discussions with consultants, evaluation of patient's response to treatment, examination of patient, obtaining history from patient or surrogate, ordering and performing treatments and interventions, ordering and review of laboratory studies, ordering and review of radiographic studies, pulse oximetry and re-evaluation of patient's condition.  Alona Bene, MD Emergency Medicine    Alec Jaros, Arlyss Repress, MD 12/14/17 1239

## 2017-12-13 NOTE — ED Notes (Signed)
Pt is awake and watching TV, standing directly beneath it.

## 2017-12-13 NOTE — Progress Notes (Signed)
Pt accepted to [facility], Bed 501-1  Nira Conn, NP is the accepting provider.  Dr. Jolyne Loa, MD is the attending provider.  Call report to [phone]  [facility] Psych ED notified.   Pt is Voluntary.  Pt may be transported by [facility]  Pt scheduled  to arrive at [facility] @12 :30 PM  Carney Bern T. Kaylyn Lim, MSW, LCSWA Disposition Clinical Social Work [phone] (cell) [phone] (office)

## 2017-12-13 NOTE — ED Triage Notes (Signed)
Husband at bed side  To visit.

## 2017-12-13 NOTE — ED Triage Notes (Signed)
Pt awake and walking in room. Pt speaking to staff .

## 2017-12-13 NOTE — ED Notes (Signed)
Husband left. Pt is sleeping at this time.

## 2017-12-13 NOTE — ED Notes (Addendum)
Husband has arrived, presently with Pt who has calmed down.

## 2017-12-13 NOTE — ED Provider Notes (Signed)
Psychiatry team contacted me about the patient and they would like CT of the head for further medical clearance.  Suspect patient with psychosis possibly secondary to polysubstance abuse and will likely be admitted inpatient for psychiatric therapy.  Head CT ordered.  Head CT unremarkable.  Patient awaiting final psychiatric disposition.  Medically cleared at this time.   Virgina Norfolk, DO 12/13/17 1014

## 2017-12-13 NOTE — ED Notes (Signed)
Lunch tray ordered 

## 2017-12-13 NOTE — ED Triage Notes (Signed)
Pt standing in room refusing any thing to drink.

## 2017-12-13 NOTE — ED Notes (Signed)
Per Dr Jacqulyn Bath, okay for pt to refuse prozac; pt is requesting something to help her sleep, Dr Jacqulyn Bath to order additional meds

## 2017-12-13 NOTE — ED Notes (Signed)
EDP at bedside  

## 2017-12-13 NOTE — ED Notes (Signed)
RN has gone over all rules and regulations for Unit; Husband informed he will have to leave after TTS is finished; EDP notified of request from family for something to aid in patient's sleep-Monique,RN

## 2017-12-13 NOTE — ED Triage Notes (Signed)
Pt is now lying in her bed.

## 2017-12-13 NOTE — ED Triage Notes (Signed)
Pt  attempting to run out of POD-F Pt redirected to her room.

## 2017-12-13 NOTE — ED Triage Notes (Signed)
Pt calmer now with husband at bed side.

## 2017-12-13 NOTE — ED Notes (Signed)
Pt walked from room to exit, attempting to leave unit. This tech and RN were able to redirect Pt back to her room. Pt was asked to stay in her room with the door open, and was reminded her lunch was on its way.

## 2017-12-13 NOTE — ED Triage Notes (Signed)
TC from [place] equesting a Head CT on Pt ,because of no prior HX of Psychosis. Referred question to EDP [place].

## 2017-12-13 NOTE — ED Notes (Signed)
Patient changed into maroon scrubs while accompanied by this RN. Belongings placed in bag for family to take home.

## 2017-12-13 NOTE — ED Notes (Signed)
Pt attempted to leave unit several times, running and pushing past ED staff. Security and [employer] were called and presently containing Pt in room. Pt continues to ambulate and dance in room, using sign language (?) and banging on the walls.

## 2017-12-13 NOTE — ED Notes (Addendum)
Pt's BP was attempted to be taken after medication was given, Pt was unable to keep still. Vitals unable to be obtained.

## 2017-12-13 NOTE — ED Notes (Signed)
Patient is dancing in hallway and takes off running through the unit; Staff able to catch patient and direct her back to her room; Patient is out of touch with reality; Patient sees the bathroom and points as if asking if she may use the bathroom; Patient was able to peeing in the toilet for urine sample; Patient does not use many words instead she uses hand gestures to communicate; although RN has heard patient say a few things while spouse was present; Patient has pleasant affect and very touchy; patient is sitting on bed kicking legs back in fourth like a child;New med ordered by [facility] NP-Monique,RN

## 2017-12-13 NOTE — ED Triage Notes (Signed)
PT refusing Thyroid Med.

## 2017-12-13 NOTE — ED Triage Notes (Signed)
Pt ran out of room into hall making hand jesters with her middle finger up . Pt ran into POA-Hall. Pt redirected to her room . Pt using hand jesters at staff when redirected to room.

## 2017-12-13 NOTE — ED Triage Notes (Signed)
PT running from room down toward POD-C . Pt redirected to her room ,Pt pushing against staff and using hand signals.

## 2017-12-13 NOTE — ED Triage Notes (Signed)
PT out of room attempting to run down hall. Pt was redirected to her room.

## 2017-12-13 NOTE — ED Triage Notes (Signed)
Husband in room with PT . Pt sitting on bed with Pt .

## 2017-12-13 NOTE — ED Triage Notes (Signed)
PT out of room and walked BR. Pt then walked to staff desk. Pt offered something to eat and Pt refused.

## 2017-12-13 NOTE — ED Triage Notes (Signed)
PT  Now on the now with husband  Sitting on the side of the bed. Pt calmer but still awake.

## 2017-12-13 NOTE — ED Notes (Signed)
Patient continues to dance in the hallway seemingly to be speaking with her geatures and body language; Patient continues to roam the unit flapping hands even as if she is hearing music in her head-Monique,RN

## 2017-12-13 NOTE — ED Triage Notes (Signed)
PT sitting on side of bed .

## 2017-12-13 NOTE — ED Notes (Signed)
Pt ambulated herself to the bathroom. Pt pulled the alarm string and turned off the lights but was easily redirected to her room and was notified her lunch was ordered. Pt looked at her breakfast tray, and this tech clarified it was her breakfast and not lunch. Pt had eaten none of her breakfast, so this tech asked if it could be thrown away. Pt nodded permission. Pt is now laying down calmly.

## 2017-12-13 NOTE — ED Notes (Signed)
Pharmacy will have to verify medication; family was unable to give accurate information; EDP made aware-Monique,RN

## 2017-12-14 ENCOUNTER — Other Ambulatory Visit: Payer: Self-pay

## 2017-12-14 ENCOUNTER — Encounter (HOSPITAL_COMMUNITY): Payer: Self-pay | Admitting: *Deleted

## 2017-12-14 DIAGNOSIS — F411 Generalized anxiety disorder: Secondary | ICD-10-CM

## 2017-12-14 DIAGNOSIS — F302 Manic episode, severe with psychotic symptoms: Principal | ICD-10-CM

## 2017-12-14 DIAGNOSIS — F29 Unspecified psychosis not due to a substance or known physiological condition: Secondary | ICD-10-CM | POA: Diagnosis present

## 2017-12-14 DIAGNOSIS — F17210 Nicotine dependence, cigarettes, uncomplicated: Secondary | ICD-10-CM

## 2017-12-14 DIAGNOSIS — F909 Attention-deficit hyperactivity disorder, unspecified type: Secondary | ICD-10-CM

## 2017-12-14 MED ORDER — PREGABALIN 75 MG PO CAPS
150.0000 mg | ORAL_CAPSULE | Freq: Two times a day (BID) | ORAL | Status: DC
  Administered 2017-12-14 – 2017-12-18 (×9): 150 mg via ORAL
  Filled 2017-12-14 (×9): qty 2

## 2017-12-14 MED ORDER — MIRTAZAPINE 15 MG PO TABS
15.0000 mg | ORAL_TABLET | Freq: Every day | ORAL | Status: DC
  Administered 2017-12-14 – 2017-12-17 (×4): 15 mg via ORAL
  Filled 2017-12-14 (×7): qty 1

## 2017-12-14 MED ORDER — HYDROXYZINE HCL 50 MG PO TABS
50.0000 mg | ORAL_TABLET | Freq: Four times a day (QID) | ORAL | Status: DC | PRN
  Administered 2017-12-17: 50 mg via ORAL
  Filled 2017-12-14: qty 1

## 2017-12-14 MED ORDER — ALPRAZOLAM 0.5 MG PO TABS: 0.5000 mg | ORAL_TABLET | Freq: Every evening | ORAL | Status: DC | PRN

## 2017-12-14 MED ORDER — ZOLPIDEM TARTRATE 5 MG PO TABS
5.0000 mg | ORAL_TABLET | Freq: Every evening | ORAL | Status: DC | PRN
  Filled 2017-12-14: qty 1

## 2017-12-14 MED ORDER — OLANZAPINE 5 MG PO TBDP
5.0000 mg | ORAL_TABLET | Freq: Every day | ORAL | Status: DC
  Filled 2017-12-14 (×3): qty 1

## 2017-12-14 MED ORDER — ZIPRASIDONE MESYLATE 20 MG IM SOLR: 20.0000 mg | Freq: Two times a day (BID) | INTRAMUSCULAR | Status: DC | PRN

## 2017-12-14 MED ORDER — THYROID 60 MG PO TABS
65.0000 mg | ORAL_TABLET | Freq: Every day | ORAL | Status: DC
  Administered 2017-12-14 – 2017-12-18 (×5): 60 mg via ORAL
  Filled 2017-12-14 (×7): qty 1

## 2017-12-14 MED ORDER — RISPERIDONE 1 MG PO TBDP: 1.0000 mg | ORAL_TABLET | Freq: Four times a day (QID) | ORAL | Status: DC | PRN

## 2017-12-14 MED ORDER — RISPERIDONE 1 MG PO TABS
1.0000 mg | ORAL_TABLET | Freq: Two times a day (BID) | ORAL | Status: DC
  Administered 2017-12-14 – 2017-12-18 (×9): 1 mg via ORAL
  Filled 2017-12-14 (×15): qty 1

## 2017-12-14 MED ORDER — LORAZEPAM 2 MG/ML IJ SOLN: 1.0000 mg | Freq: Four times a day (QID) | INTRAMUSCULAR | Status: DC | PRN

## 2017-12-14 MED ORDER — LORAZEPAM 1 MG PO TABS: 1.0000 mg | ORAL_TABLET | ORAL | Status: DC | PRN

## 2017-12-14 MED ORDER — LORAZEPAM 1 MG PO TABS: 1.0000 mg | ORAL_TABLET | Freq: Four times a day (QID) | ORAL | Status: DC | PRN

## 2017-12-14 MED ORDER — FLUOXETINE HCL 20 MG PO CAPS
40.0000 mg | ORAL_CAPSULE | Freq: Every day | ORAL | Status: DC
  Administered 2017-12-14: 40 mg via ORAL
  Filled 2017-12-14 (×3): qty 2

## 2017-12-14 NOTE — Plan of Care (Signed)
  Problem: Safety: Goal: Periods of time without injury will increase Outcome: Progressing   Problem: Activity: Goal: Interest or engagement in activities will improve Outcome: Progressing   Problem: Health Behavior/Discharge Planning: Goal: Compliance with prescribed medication regimen will improve Outcome: Progressing  DAR NOTE: Patient presents with anxious affect and depressed mood.  Denies suicidal thoughts, pain, auditory and visual hallucinations.  Described energy level as normal and concentration as good.  Rates depression at 0, hopelessness at 0, and anxiety at 0.  Maintained on routine safety checks.  Medications given as prescribed.  Support and encouragement offered as needed.  Attended group and participated.  States goal for today is "finding Genelle BalBrett, seeing him."  Patient observed socializing with peers in the dayroom.  Offered no complaint.
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
## 2017-12-14 NOTE — BHH Suicide Risk Assessment (Signed)
[facility] INPATIENT:  Family/Significant Other Suicide Prevention Education  Suicide Prevention Education:  Education Completed; No one has been identified by the patient as the family member/significant other with whom the patient will be residing, and identified as the person(s) who will aid the patient in the event of a mental health crisis (suicidal ideations/suicide attempt).  With written consent from the patient, the family member/significant other has been provided the following suicide prevention education, prior to the and/or following the discharge of the patient.  The suicide prevention education provided includes the following:  Suicide risk factors  Suicide prevention and interventions  National Suicide Hotline telephone number  [facility] assessment telephone number  [facility] Emergency Assistance 911  [facility] and/or [facility]idential Mobile Crisis [facility] telephone number  Request made of family/significant other to:  Remove weapons (e.g., guns, rifles, knives), all items previously/currently identified as safety concern.    Remove drugs/medications (over-the-counter, prescriptions, illicit drugs), all items previously/currently identified as a safety concern.  The family member/significant other verbalizes understanding of the suicide prevention education information provided.  The family member/significant other agrees to remove the items of safety concern listed above. The patient did not endorse SI at the time of admission, nor did the patient c/o SI during the stay here.  SPE not required. However, I did talk to husband, Lupita LeashBrett Coral, [phone], and went over treatment team recommendations and crises plan. Ida RogueRodney B Dieter Hane 12/14/2017, 5:00 PM

## 2017-12-14 NOTE — Progress Notes (Signed)
Jenny Jennings is a 52 year old female pt admitted on involuntary basis. On admission, Jenny Jennings was agitated, argumentative and needed much support and re-direction to complete admission process. She was upset that her husband had her committed to the hospital and asked on numerous occasions how she could leave. She did try at one time during the admission process to leave but was re-directed back to the search room without incident. She did acknowledge that she was feeling stressed and overwhelmed but kept insisting that she was ok to go home. She denied any SI and was able to contract for safety on the unit. She did report that she takes her medications as prescribed and reports that she has been taking xanax for a couple years now. The husband reports that she has never acted this way before and reports that she began to decompensate over the weekend and progressively got worse to where he needed to bring her to the ED. Jenny Jennings does endorse occasional marijuana usage but denies any other substance use. She reports that she lives at home with her husband and two children and reports that she will return there once she is discharged. Jenny Jennings was escorted back to the unit, oriented to the milieu and safety was maintained.

## 2017-12-14 NOTE — Progress Notes (Signed)
Recreation Therapy Notes  INPATIENT RECREATION THERAPY ASSESSMENT  Patient Details Name: Jenny Jennings MRN: [id number] DOB: 04/15/1965 Today's Date: 12/14/2017       Information Obtained From: Patient  Able to Participate in Assessment/Interview: Yes  Patient Presentation: Alert  Reason for Admission (Per Patient): Other (Comments)(Pt stated her husband was worried about her.)  Patient Stressors: Family, Work, Other (Comment)(Dog)  Coping Skills:   Isolation, Journal, TV, Arguments, Aggression, Avoidance, Music, Exercise, Meditate, Deep Breathing, Substance Abuse, Impulsivity, Talk, Art, Intrusive Behavior, Read, Dance, Hot Bath/Shower  Leisure Interests (2+):  Social - Family, Art - MudloggerColoring, Individual - TV  Frequency of Recreation/Participation: Other (Comment)(Daily)  Awareness of Community Resources:  Yes  Community Resources:  Coffee Shop, Engineering geologistLibrary  Current Use: Yes  If no, Barriers?:    Expressed Interest in [employer] CorporationCommunity Resource Information: No  [place] of [place]idence:  [place]  Patient Main Form of Transportation: Set designerCar  Patient Strengths:  Strong, beautiful, bold, smart  Patient Identified Areas of Improvement:  Reaching out for help; accepting direction  Patient Goal for Hospitalization:  "To get out"  Current SI (including self-harm):  No  Current HI:  No  Current AVH: No  Staff Intervention Plan: Group Attendance, Collaborate with Interdisciplinary Treatment Team  Consent to Intern Participation: N/Jennings    Jenny RancherMarjette Dayson Jennings, Jenny Jennings  Jenny AbedLindsay, Jenny Jennings 12/14/2017, 1:18 PM

## 2017-12-14 NOTE — [facility] Suicide Risk Assessment (Signed)
[facility]   Nursing information obtained from:  Patient Demographic factors:  Caucasian Current Mental Status:  NA Loss Factors:  NA Historical Factors:  Family history of mental illness or substance abuse Risk Reduction Factors:  Responsible for children under 518 years of age, Living with another person, especially a relative, Positive coping skills or problem solving skills  Total Time spent with patient: 1 hour Principal Problem: Bipolar I disorder, single manic episode, severe, with psychosis (HCC) Diagnosis:   Patient Active Problem List   Diagnosis Date Noted  . 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 Bipolar I disorder, single manic episode, severe, with psychosis (HCC) [F30.2] 12/14/2017  . Obesity [E66.9] 01/31/2014  . Excessive daytime sleepiness [G47.19] 01/31/2014  . Hypothyroidism [E03.9] 01/31/2014  . Annual physical exam [Z00.00] 01/31/2014   Subjective Data: see H&P  Continued Clinical Symptoms:  Alcohol Use Disorder Identification Test Final Score (AUDIT): 3 The "Alcohol Use Disorders Identification Test", Guidelines for Use in Primary Care, Second Edition.  World Science writerHealth Organization [facility](WHO). Score between 0-7:  no or low risk or alcohol related problems. Score between 8-15:  moderate risk of alcohol related problems. Score between 16-19:  high risk of alcohol related problems. Score 20 or above:  warrants further diagnostic evaluation for alcohol dependence and treatment.  Psychiatric Specialty Exam: Physical Exam  Nursing note and vitals reviewed.     Blood pressure 131/89, pulse 83, temperature 97.8 F (36.6 C), temperature source Oral, resp. rate 18, height 5\' 6"  (1.676 m), weight 93 kg.Body mass index is 33.09 kg/m.   COGNITIVE FEATURES THAT CONTRIBUTE TO RISK:  None    SUICIDE RISK:   Minimal: No identifiable suicidal ideation.  Patients presenting with no risk factors but with morbid ruminations; may be classified as  minimal risk based on the severity of the depressive symptoms  PLAN OF CARE: see H&P  I certify that inpatient services furnished can reasonably be expected to improve the patient's condition.   Micheal Likenshristopher T Bayden Gil, MD 12/14/2017, 3:58 PM

## 2017-12-14 NOTE — Tx Team (Signed)
Initial Treatment Plan 12/14/2017 12:27 AM Jenny IpPamela Jennings ZOX:[id number]    PATIENT STRESSORS: Marital or family conflict Occupational concerns   PATIENT STRENGTHS: Ability for insight Average or above average intelligence General fund of knowledge Supportive family/friends   PATIENT IDENTIFIED PROBLEMS: Psychosis Insomnia "I want to know what I need to do so I can get out of here"                     DISCHARGE CRITERIA:  Ability to meet basic life and health needs Improved stabilization in mood, thinking, and/or behavior Verbal commitment to aftercare and medication compliance  PRELIMINARY DISCHARGE PLAN: Attend aftercare/continuing care group Return to previous living arrangement  PATIENT/FAMILY INVOLVEMENT: This treatment plan has been presented to and reviewed with the patient, Jenny Jennings, and/or family member, .  The patient and family have been given the opportunity to ask questions and make suggestions.  Yzabella Crunk, [place], [place] 12/14/2017, 12:27 AM

## 2017-12-14 NOTE — BHH Group Notes (Signed)
LCSW Group Therapy Note  12/14/2017 1:15pm  Type of Therapy/Topic:  Group Therapy:  Balance in Life  Participation Level:  Active  Description of Group:    This group will address the concept of balance and how it feels and looks when one is unbalanced. Patients will be encouraged to process areas in their lives that are out of balance and identify reasons for remaining unbalanced. Facilitators will guide patients in utilizing problem-solving interventions to address and correct the stressor making their life unbalanced. Understanding and applying boundaries will be explored and addressed for obtaining and maintaining a balanced life. Patients will be encouraged to explore ways to assertively make their unbalanced needs known to significant others in their lives, using other group members and facilitator for support and feedback.  Therapeutic Goals: 1. Patient will identify two or more emotions or situations they have that consume much of in their lives. 2. Patient will identify signs/triggers that life has become out of balance:  3. Patient will identify two ways to set boundaries in order to achieve balance in their lives:  4. Patient will demonstrate ability to communicate their needs through discussion and/or role plays  Summary of Patient Progress:  Attended entire session. Described abandonment as an emotion that may create imbalance in her life.Coloring helps her stay balanced.    Therapeutic Modalities:   Cognitive Behavioral Therapy Solution-Focused Therapy Assertiveness Training  Ida RogueRodney B Gianluca Chhim, [place]CS[place] 12/14/2017 1:26 PM

## 2017-12-14 NOTE — Progress Notes (Signed)
Recreation Therapy Notes  Date: 9.12.19 Time: 0955 Location: [address] Dayroom  Group Topic: Self-Esteem  Goal Area(s) Addresses:  Patient will successfully identify positive attributes about themselves.  Patient will successfully identify benefit of improved self-esteem.   Intervention: Worksheet, colored pencils, markers  Activity: Customized Plates.  Patients were given a blank outline of a license plate.  Patients were to design the plate with things that describe them such as birth dates, favorite foods, important dates, accomplishments, things that make them unique or things they are proud of.  Education:  Self-Esteem, Building control surveyorDischarge Planning.   Education Outcome: Acknowledges education/In group clarification offered/Needs additional education  Clinical Observations/Feedback: Pt did not attend group.    Caroll RancherMarjette Deliah Strehlow, LRT/CTRS         Caroll RancherLindsay, Jlyn Bracamonte A 12/14/2017 11:54 AM

## 2017-12-14 NOTE — H&P (Signed)
Psychiatric Admission Assessment Adult  Patient Identification: Jenny Jennings MRN:  [id number] Date of Evaluation:  12/14/2017 Chief Complaint:  BRIEF PSYCHOTIC DISORDER GAD Principal Diagnosis: Bipolar I disorder, single manic episode, severe, with psychosis ([place]) Diagnosis:   Patient Active Problem List   Diagnosis Date Noted  . 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 Bipolar I disorder, single manic episode, severe, with psychosis ([place]) [F30.2] 12/14/2017  . Obesity [E66.9] 01/31/2014  . Excessive daytime sleepiness [G47.19] 01/31/2014  . Hypothyroidism [E03.9] 01/31/2014  . Annual physical exam [B34.19] 01/31/2014   History of Present Illness:   Jenny Jennings is a 52 y/o F with history of treatment for GAD and ADHD who was admitted on IVC initiated at [facility] after she presented there with her husband with bizarre behaviors and not eating or drinking for days. Head CT was unremarkable. Pt was agitated and bizarre in the ED. She had difficulty communicating. She was dancing in the hallways and required redirection several times. She was medically cleared and then transferred to [facility] for additional treatment and stabilization.  Upon initial interview, pt shares, "I think my husband brought me in because I'm under a lot of stress. I needed to go to sleep." Pt reports stressor of recently her mother was diagnose with Alzheimer's disease. Pt continues, "My brain - I feel like a I need to crack a code. I don't feel safe." Pt endorses paranoia and she is concerned that her husband and parents have been killed. She endorses fluctuant sleep pattern and decreased appetite, but she denies other symptoms of depression. She endorses distractibility, increased activities, flight of ideas, decreased need for sleep with little to no sleep for 2 days, some mildly grandiose thoughts that she should be living in a mansion, and some reckless spending. She denies SI/HI/AH/VH. She denies symptoms of OCD  and PTSD. She reports using alcohol in binge pattern about twice per year, using alcohol about twice per week, and she smokes 1.5 ppd. She denies other substance use.  Discussed with patient about treatment options. She takes lyrica, vyvanse, xanax 0.27m at bedtime, and phentermine at home. We discussed continuing lyrica but stopping her other home psychotropic medications and starting trial of remeron and risperdal, and pt was in agreement.    Associated Signs/Symptoms: Depression Symptoms:  insomnia, difficulty concentrating, anxiety, (Hypo) Manic Symptoms:  Delusions, Distractibility, Flight of Ideas, Impulsivity, Labiality of Mood, Anxiety Symptoms:  Excessive Worry, Psychotic Symptoms:  Delusions, Paranoia, PTSD Symptoms: NA Total Time spent with patient: 1 hour  Past Psychiatric History:  -previous dx of ADHD - no previous inpt stays - no previous outpatient treatment - no previous suicide attempts - previous EMDR therapy about 4-6 months ago  Is the patient at risk to self? Yes.    Has the patient been a risk to self in the past 6 months? Yes.    Has the patient been a risk to self within the distant past? Yes.    Is the patient a risk to others? Yes.    Has the patient been a risk to others in the past 6 months? Yes.    Has the patient been a risk to others within the distant past? Yes.     Prior Inpatient Therapy:   Prior Outpatient Therapy:    Alcohol Screening: 1. How often do you have a drink containing alcohol?: 2 to 4 times a month 2. How many drinks containing alcohol do you have on a typical day when you are drinking?: 1  or 2 3. How often do you have six or more drinks on one occasion?: Less than monthly AUDIT-C Score: 3 4. How often during the last year have you found that you were not able to stop drinking once you had started?: Never 5. How often during the last year have you failed to do what was normally expected from you becasue of drinking?:  Never 6. How often during the last year have you needed a first drink in the morning to get yourself going after a heavy drinking session?: Never 7. How often during the last year have you had a feeling of guilt of remorse after drinking?: Never 8. How often during the last year have you been unable to remember what happened the night before because you had been drinking?: Never 9. Have you or someone else been injured as a result of your drinking?: No 10. Has a relative or friend or a doctor or another health worker been concerned about your drinking or suggested you cut down?: No Alcohol Use Disorder Identification Test Final Score (AUDIT): 3 Intervention/Follow-up: AUDIT Score <7 follow-up not indicated Substance Abuse History in the last 12 months:  Yes.   Consequences of Substance Abuse: Medical Consequences:  worsened mood and psychotic symptoms Previous Psychotropic Medications: Yes  Psychological Evaluations: Yes  Past Medical History: History reviewed. No pertinent past medical history. History reviewed. No pertinent surgical history. Family History: History reviewed. No pertinent family history. Family Psychiatric  History: pt was adopted and does not know family history. Tobacco Screening: Have you used any form of tobacco in the last 30 days? (Cigarettes, Smokeless Tobacco, Cigars, and/or Pipes): Yes Tobacco use, Select all that apply: 5 or more cigarettes per day Are you interested in Tobacco Cessation Medications?: Yes, will notify MD for an order Counseled patient on smoking cessation including recognizing danger situations, developing coping skills and basic information about quitting provided: Refused/Declined practical counseling Social History: Pt was born and raised in [place]. She has lived in the [place] area for the past 10 years. She lives with her husband and her two daughters (ages 45 and 23). She stopped working as an Warden/ranger about 1.5 months ago to spend more  time with her mother. She denies legal history. She endorses emotional trauma during childhood. Social History   Substance and Sexual Activity  Alcohol Use Not Currently     Social History   Substance and Sexual Activity  Drug Use Yes  . Types: Marijuana    Additional Social History: Marital status: Married Number of Years Married: 25 What types of issues is patient dealing with in the relationship?: Good supportive communication Additional relationship information: They have known each other since the 3rd grade, there have never been any mental health behaviors like this Are you sexually active?: Yes Does patient have children?: Yes How many children?: 2 How is patient's relationship with their children?: Good                         Allergies:  No Known Allergies Lab Results:  Results for orders placed or performed during the hospital encounter of 12/12/17 (from the past 48 hour(s))  Comprehensive metabolic panel     Status: Abnormal   Collection Time: 12/12/17 10:25 PM  Result Value Ref Range   Sodium 139 135 - 145 mmol/L   Potassium 3.1 (L) 3.5 - 5.1 mmol/L   Chloride 106 98 - 111 mmol/L   CO2 21 (L) 22 -  32 mmol/L   Glucose, Bld 121 (H) 70 - 99 mg/dL   BUN 5 (L) 6 - 20 mg/dL   Creatinine, Ser 0.83 0.44 - 1.00 mg/dL   Calcium 9.4 8.9 - 10.3 mg/dL   Total Protein 6.9 6.5 - 8.1 g/dL   Albumin 3.9 3.5 - 5.0 g/dL   AST 36 15 - 41 U/L   ALT 53 (H) 0 - 44 U/L   Alkaline Phosphatase 94 38 - 126 U/L   Total Bilirubin 0.5 0.3 - 1.2 mg/dL   GFR calc non Af Amer >60 >60 mL/min   GFR calc Af Amer >60 >60 mL/min    Comment: (NOTE) The eGFR has been calculated using the CKD EPI equation. This calculation has not been validated in all clinical situations. eGFR's persistently <60 mL/min signify possible Chronic Kidney Disease.    Anion gap 12 5 - 15    Comment: Performed at [facility] [address] [place], [place] [address]  Ethanol     Status: None    Collection Time: 12/12/17 10:25 PM  Result Value Ref Range   Alcohol, Ethyl (B) <10 <10 mg/dL    Comment: (NOTE) Lowest detectable limit for serum alcohol is 10 mg/dL. For medical purposes only. Performed at [facility], [place] [address], [place], [place] [address]   Salicylate level     Status: None   Collection Time: 12/12/17 10:25 PM  Result Value Ref Range   Salicylate Lvl <7.7 2.8 - 30.0 mg/dL    Comment: Performed at [facility] [address] [place], [place] [address]  Acetaminophen level     Status: Abnormal   Collection Time: 12/12/17 10:25 PM  Result Value Ref Range   Acetaminophen (Tylenol), Serum <10 (L) 10 - 30 ug/mL    Comment: (NOTE) Therapeutic concentrations vary significantly. A range of 10-30 ug/mL  may be an effective concentration for many patients. However, some  are best treated at concentrations outside of this range. Acetaminophen concentrations >150 ug/mL at 4 hours after ingestion  and >50 ug/mL at 12 hours after ingestion are often associated with  toxic reactions. Performed at [facility], [place] [address], [place], [place] [address]   cbc     Status: Abnormal   Collection Time: 12/12/17 10:25 PM  Result Value Ref Range   WBC 11.3 (H) 4.0 - 10.5 K/uL   RBC 4.44 3.87 - 5.11 MIL/uL   Hemoglobin 12.7 12.0 - 15.0 g/dL   HCT 38.8 36.0 - 46.0 %   MCV 87.4 78.0 - 100.0 fL   MCH 28.6 26.0 - 34.0 pg   MCHC 32.7 30.0 - 36.0 g/dL   RDW 13.6 11.5 - 15.5 %   Platelets 371 150 - 400 K/uL    Comment: Performed at [facility] [address] [place], [place] [address]  Rapid urine drug screen (hospital performed)     Status: Abnormal   Collection Time: 12/13/17  2:43 AM  Result Value Ref Range   Opiates NONE DETECTED NONE DETECTED   Cocaine NONE DETECTED NONE DETECTED   Benzodiazepines POSITIVE (A) NONE DETECTED   Amphetamines POSITIVE (A) NONE DETECTED   Tetrahydrocannabinol POSITIVE (A) NONE DETECTED   Barbiturates NONE  DETECTED NONE DETECTED    Comment: (NOTE) DRUG SCREEN FOR MEDICAL PURPOSES ONLY.  IF CONFIRMATION IS NEEDED FOR ANY PURPOSE, NOTIFY LAB WITHIN 5 DAYS. LOWEST DETECTABLE LIMITS FOR URINE DRUG SCREEN Drug Class  Cutoff (ng/mL) Amphetamine and metabolites    1000 Barbiturate and metabolites    200 Benzodiazepine                 161 Tricyclics and metabolites     300 Opiates and metabolites        300 Cocaine and metabolites        300 THC                            50 Performed at [facility], [place] [address] [place], [place] [address]   POC Urine Pregnancy, ED (do NOT order at [facility])     Status: None   Collection Time: 12/13/17  2:50 AM  Result Value Ref Range   Preg Test, Ur NEGATIVE NEGATIVE    Comment:        THE SENSITIVITY OF THIS METHODOLOGY IS >24 mIU/mL     Blood Alcohol level:  Lab Results  Component Value Date   ETH <10 54/12/8117    Metabolic Disorder Labs:  No results found for: HGBA1C, MPG Lab Results  Component Value Date   PROLACTIN 6.4 03/18/2014   Lab Results  Component Value Date   CHOL 186 02/06/2014   TRIG 82 02/06/2014   HDL 55 02/06/2014   CHOLHDL 3.4 02/06/2014   VLDL 16 02/06/2014   LDLCALC 115 (H) 02/06/2014    Current Medications: Current Facility-Administered Medications  Medication Dose Route Frequency Provider Last Rate Last Dose  . hydrOXYzine (ATARAX/VISTARIL) tablet 50 mg  50 mg Oral Q6H PRN Pennelope Bracken, MD      . LORazepam (ATIVAN) tablet 1 mg  1 mg Oral Q6H PRN Pennelope Bracken, MD       Or  . LORazepam (ATIVAN) injection 1 mg  1 mg Intramuscular Q6H PRN Pennelope Bracken, MD      . mirtazapine (REMERON) tablet 15 mg  15 mg Oral QHS Pennelope Bracken, MD      . pregabalin (LYRICA) capsule 150 mg  150 mg Oral BID Rankin, Shuvon B, NP   150 mg at 12/14/17 1478  . risperiDONE (RISPERDAL M-TABS) disintegrating tablet 1 mg  1 mg Oral Q6H PRN Pennelope Bracken, MD        Or  . ziprasidone (GEODON) injection 20 mg  20 mg Intramuscular Q12H PRN Pennelope Bracken, MD      . risperiDONE (RISPERDAL) tablet 1 mg  1 mg Oral BID Pennelope Bracken, MD   1 mg at 12/14/17 1252  . thyroid (ARMOUR) tablet 60 mg  60 mg Oral Daily Rankin, Shuvon B, NP   60 mg at 12/14/17 0820  . zolpidem (AMBIEN) tablet 5 mg  5 mg Oral QHS PRN Pennelope Bracken, MD       PTA Medications: Medications Prior to Admission  Medication Sig Dispense Refill Last Dose  . ALPRAZolam (XANAX) 0.5 MG tablet Take 0.5 mg by mouth at bedtime as needed for sleep.    Past Week at Unknown time  . FLUoxetine (PROZAC) 40 MG capsule Take 40 mg by mouth daily.   Past Week at Unknown time  . ibuprofen (ADVIL,MOTRIN) 800 MG tablet Take 800 mg by mouth every 8 (eight) hours as needed.   Past Week at Unknown time  . lisdexamfetamine (VYVANSE) 40 MG capsule Take 40 mg by mouth every morning.   Past Week at Unknown time  . pregabalin (LYRICA) 150 MG capsule  Take 150 mg by mouth 2 (two) times daily.   Past Week at Unknown time  . thyroid (NATURE-THROID) 65 MG tablet Take 65 mg by mouth daily.    Past Week at Unknown time    Musculoskeletal: Strength & Muscle Tone: within normal limits Gait & Station: normal Patient leans: N/A  Psychiatric Specialty Exam: Physical Exam  Nursing note and vitals reviewed.   Review of Systems  Constitutional: Negative for chills and fever.  Respiratory: Negative for cough and shortness of breath.   Cardiovascular: Negative for chest pain.  Gastrointestinal: Negative for abdominal pain, heartburn, nausea and vomiting.  Psychiatric/Behavioral: Negative for depression, hallucinations and suicidal ideas. The patient is not nervous/anxious and does not have insomnia.     Blood pressure 131/89, pulse 83, temperature 97.8 F (36.6 C), temperature source Oral, resp. rate 18, height '5\' 6"'  (1.676 m), weight 93 kg.Body mass index is 33.09 kg/m.  General Appearance:  Casual and Fairly Groomed  Eye Contact:  Good  Speech:  Clear and Coherent and Normal Rate  Volume:  Normal  Mood:  Anxious  Affect:  Blunt, Congruent and Constricted  Thought Process:  Coherent, Goal Directed and Descriptions of Associations: Loose  Orientation:  Full (Time, Place, and Person)  Thought Content:  Delusions, Ideas of Reference:   Paranoia Delusions and Paranoid Ideation  Suicidal Thoughts:  No  Homicidal Thoughts:  No  Memory:  Immediate;   Fair Recent;   Fair Remote;   Fair  Judgement:  Poor  Insight:  Lacking  Psychomotor Activity:  Normal  Concentration:  Concentration: Poor  Recall:  [employer] of Knowledge:  Fair  Language:  Fair  Akathisia:  No  Handed:    AIMS (if indicated):     Assets:  Resilience Social Support  ADL's:  Intact  Cognition:  WNL  Sleep:       Treatment Plan Summary: Daily contact with patient to assess and evaluate symptoms and progress in treatment and Medication management  Observation Level/Precautions:  15 minute checks  Laboratory:  CBC Chemistry Profile HbAIC UDS UA  Psychotherapy:  Encourage participation in groups and therapeutic milieu   Medications:  DC home meds of phentermine, vyvanse, and xanax. Start Remeron 1m po qDay. Start risperdal 165mpo BID. Continue all other current orders without changes. See MAR for agitation orders.  Consultations:    Discharge Concerns:    Estimated LOS: 5-7 days  Other:     Physician Treatment Plan for Primary Diagnosis: Bipolar I disorder, single manic episode, severe, with psychosis ([place] Go[place] Improvement in symptoms so as ready for discharge  Short Term Goals: Ability to identify and develop effective coping behaviors will improve  Physician Treatment Plan for Secondary Diagnosis: Principal Problem:   Bipolar I disorder, single manic episode, severe, with psychosis ([place]erton[place] Problems:   Psychotic disorder ([place] Long Term Goal(s): Improvement in  symptoms so as ready for discharge  Short Term Goals: Ability to demonstrate self-control will improve  I certify that inpatient services furnished can reasonably be expected to improve the patient's condition.    ChPennelope BrackenMD 9/12/20193:50 PM

## 2017-12-15 NOTE — Progress Notes (Signed)
Patient did not attend wrap up group. 

## 2017-12-15 NOTE — Progress Notes (Signed)
D: Pt A & O to self, place and time.Denies SI, HI, AVH and pain when assessed. Pt rates his anxiety, depression and hopelessness all 0/10. Pt c/o nasal congestion "I can't breathe, I need something my nose is stuffy". Visible in dayroom for groups. Showered and changed her clothes. Appears less paranoid and more engaged with others this shift. A: Scheduled medications given as ordered. Emotional support and encouragement offered to pt as needed. [facility] d/t nasal congestion. Safety checks maintained without self harm gestures outburst.  R: Pt compliant with medications, denies adverse drug reactions when assessed. Verbalized understanding related to medication education (sudafed). Tolerates all PO intake well. Remains safe on and off unit.

## 2017-12-15 NOTE — Tx Team (Signed)
Interdisciplinary Treatment and Diagnostic Plan Update  12/15/2017 Time of Session: 10:21 AM  Jenny Jennings MRN: [id number]  Principal Diagnosis: Bipolar I disorder, single manic episode, severe, with psychosis ([place])  Secondary Diagnoses: Principal Problem:   Bipolar I disorder, single manic episode, severe, with psychosis ([place]) Active Problems:   Psychotic disorder ([place])   Current Medications:  Current Facility-Administered Medications  Medication Dose Route Frequency Provider Last Rate Last Dose  . hydrOXYzine (ATARAX/VISTARIL) tablet 50 mg  50 mg Oral Q6H PRN Pennelope Bracken, MD      . LORazepam (ATIVAN) tablet 1 mg  1 mg Oral Q6H PRN Pennelope Bracken, MD       Or  . LORazepam (ATIVAN) injection 1 mg  1 mg Intramuscular Q6H PRN Pennelope Bracken, MD      . mirtazapine (REMERON) tablet 15 mg  15 mg Oral QHS Pennelope Bracken, MD   15 mg at 12/14/17 2204  . pregabalin (LYRICA) capsule 150 mg  150 mg Oral BID Rankin, Shuvon B, NP   150 mg at 12/15/17 0751  . risperiDONE (RISPERDAL M-TABS) disintegrating tablet 1 mg  1 mg Oral Q6H PRN Pennelope Bracken, MD       Or  . ziprasidone (GEODON) injection 20 mg  20 mg Intramuscular Q12H PRN Pennelope Bracken, MD      . risperiDONE (RISPERDAL) tablet 1 mg  1 mg Oral BID Pennelope Bracken, MD   1 mg at 12/15/17 0751  . thyroid (ARMOUR) tablet 60 mg  60 mg Oral Daily Rankin, Shuvon B, NP   60 mg at 12/15/17 0751  . zolpidem (AMBIEN) tablet 5 mg  5 mg Oral QHS PRN Pennelope Bracken, MD        PTA Medications: Medications Prior to Admission  Medication Sig Dispense Refill Last Dose  . ALPRAZolam (XANAX) 0.5 MG tablet Take 0.5 mg by mouth at bedtime as needed for sleep.    Past Week at Unknown time  . FLUoxetine (PROZAC) 40 MG capsule Take 40 mg by mouth daily.   Past Week at Unknown time  . ibuprofen (ADVIL,MOTRIN) 800 MG tablet Take 800 mg by mouth every 8 (eight) hours as needed.    Past Week at Unknown time  . lisdexamfetamine (VYVANSE) 40 MG capsule Take 40 mg by mouth every morning.   Past Week at Unknown time  . pregabalin (LYRICA) 150 MG capsule Take 150 mg by mouth 2 (two) times daily.   Past Week at Unknown time  . thyroid (NATURE-THROID) 65 MG tablet Take 65 mg by mouth daily.    Past Week at Unknown time    Patient Stressors: Marital or family conflict Occupational concerns  Patient Strengths: Ability for insight Average or above average intelligence General fund of knowledge Supportive family/friends  Treatment Modalities: Medication Management, Group therapy, Case management,  1 to 1 session with clinician, Psychoeducation, Recreational therapy.   Physician Treatment Plan for Primary Diagnosis: Bipolar I disorder, single manic episode, severe, with psychosis ([place]) Long Term Goal(s): Improvement in symptoms so as ready for discharge  Short Term Goals: Ability to identify and develop effective coping behaviors will improve Ability to demonstrate self-control will improve  Medication Management: Evaluate patient's response, side effects, and tolerance of medication regimen.  Therapeutic Interventions: 1 to 1 sessions, Unit Group sessions and Medication administration.  Evaluation of Outcomes: Progressing  Physician Treatment Plan for Secondary Diagnosis: Principal Problem:   Bipolar I disorder, single manic episode, severe, with psychosis ([place])  Active Problems:   Psychotic disorder ([place])   Long Term Goal(s): Improvement in symptoms so as ready for discharge  Short Term Goals: Ability to identify and develop effective coping behaviors will improve Ability to demonstrate self-control will improve  Medication Management: Evaluate patient's response, side effects, and tolerance of medication regimen.  Therapeutic Interventions: 1 to 1 sessions, Unit Group sessions and Medication administration.  Evaluation of Outcomes: Progressing   RN  Treatment Plan for Primary Diagnosis: Bipolar I disorder, single manic episode, severe, with psychosis ([place]) Long Term Goal(s): Knowledge of disease and therapeutic regimen to maintain health will improve  Short Term Goals: Ability to identify and develop effective coping behaviors will improve and Compliance with prescribed medications will improve  Medication Management: RN will administer medications as ordered by provider, will assess and evaluate patient's response and provide education to patient for prescribed medication. RN will report any adverse and/or side effects to prescribing provider.  Therapeutic Interventions: 1 on 1 counseling sessions, Psychoeducation, Medication administration, Evaluate responses to treatment, Monitor vital signs and CBGs as ordered, Perform/monitor CIWA, COWS, AIMS and Fall Risk screenings as ordered, Perform wound care treatments as ordered.  Evaluation of Outcomes: Progressing   LCSW Treatment Plan for Primary Diagnosis: Bipolar I disorder, single manic episode, severe, with psychosis ([place]) Long Term Goal(s): Safe transition to appropriate next level of care at discharge, Engage patient in therapeutic group addressing interpersonal concerns.  Short Term Goals: Engage patient in aftercare planning with referrals and resources  Therapeutic Interventions: Assess for all discharge needs, 1 to 1 time with Social worker, Explore available resources and support systems, Assess for adequacy in community support network, Educate family and significant other(s) on suicide prevention, Complete Psychosocial Assessment, Interpersonal group therapy.  Evaluation of Outcomes: Met  Return home, follow up Day in Treatment: Attending groups: Yes Participating in groups: Yes Taking medication as prescribed: Yes Toleration medication: Yes, no side effects reported at this time Family/Significant other contact made: Yes Patient understands  diagnosis: Yes AEB asking for help with stress Discussing patient identified problems/goals with staff: Yes Medical problems stabilized or resolved: Yes Denies suicidal/homicidal ideation: Yes Issues/concerns per patient self-inventory: None Other: N/A  New problem(s) identified: None identified at this time.   New Short Term/Long Term Goal(s): "My goal is to get the anxiety under control and get on meds that help."   Discharge Plan or Barriers:   Reason for Continuation of Hospitalization: Disorganization Mood Instability Medication stabilization   Estimated Length of Stay: 9/18  Attendees: Patient: Jenny Jennings 12/15/2017  10:21 AM  Physician: Maris Berger, MD 12/15/2017  10:21 AM  Nursing: Sena Hitch, RN 12/15/2017  10:21 AM  RN Care Manager: Lars Pinks, RN 12/15/2017  10:21 AM  Social Worker: Ripley Fraise 12/15/2017  10:21 AM  Recreational Therapist: Winfield Cunas 12/15/2017  10:21 AM  Other: Norberto Sorenson 12/15/2017  10:21 AM  Other:  12/15/2017  10:21 AM    Scribe for Treatment Team:  Roque Lias LCSW 12/15/2017 10:21 AM

## 2017-12-15 NOTE — Progress Notes (Signed)
[facility] MD Progress Note  12/15/2017 2:57 PM Jenny Jennings  MRN:  [id number] Subjective:    Jenny Jennings" Jenny Jennings is a 52 y/o F with history of treatment for GAD and ADHD who was admitted on IVC initiated at [facility] after she presented there with her husband with bizarre behaviors and not eating or drinking for days. Head CT was unremarkable. Pt was agitated and bizarre in the ED. She had difficulty communicating. She was dancing in the hallways and required redirection several times. She was medically cleared and then transferred to [facility] for additional treatment and stabilization. She was started on trial of remeron and risperdal, and she was continued on home medication of lyrica while vyvanse, xanax, and phentermine were discontinued. Pt has been reporting incremental improvement of her presenting symptoms.  Today upon evaluation, pt shares, "I'm doing good but I keep missing the times to go to group." Pt denies other specific concerns. She is sleeping well. Her appetite is good. Her mood is good. She reports some minimal anxiety about worrying about her family, but she denies any component of fear that her family is dead as she had expressed on her intake interview. She denies SI/HI/AH/VH. She is tolerating her medications well, and she is in agreement to continue her current regimen without changes. She had no further questions, comments, or concerns.   Principal Problem: Bipolar I disorder, single manic episode, severe, with psychosis (HCC) Diagnosis:   Patient Active Problem List   Diagnosis Date Noted  . 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 Bipolar I disorder, single manic episode, severe, with psychosis (HCC) [F30.2] 12/14/2017  . Obesity [E66.9] 01/31/2014  . Excessive daytime sleepiness [G47.19] 01/31/2014  . Hypothyroidism [E03.9] 01/31/2014  . Annual physical exam [Z00.00] 01/31/2014   Total Time spent with patient: 30 minutes  Past Psychiatric History: see H&P  Past Medical  History: History reviewed. No pertinent past medical history. History reviewed. No pertinent surgical history. Family History: History reviewed. No pertinent family history. Family Psychiatric  History: see H&P Social History:  Social History   Substance and Sexual Activity  Alcohol Use Not Currently     Social History   Substance and Sexual Activity  Drug Use Yes  . Types: Marijuana    Social History   Socioeconomic History  . Marital status: Married    Spouse name: Not on file  . Number of children: Not on file  . Years of education: Not on file  . Highest education level: Not on file  Occupational History  . Not on file  Social Needs  . Financial resource strain: Not on file  . Food insecurity:    Worry: Not on file    Inability: Not on file  . Transportation needs:    Medical: Not on file    Non-medical: Not on file  Tobacco Use  . Smoking status: Current Every Day Smoker  . Smokeless tobacco: Never Used  Substance and Sexual Activity  . Alcohol use: Not Currently  . Drug use: Yes    Types: Marijuana  . Sexual activity: Yes  Lifestyle  . Physical activity:    Days per week: Not on file    Minutes per session: Not on file  . Stress: Not on file  Relationships  . Social connections:    Talks on phone: Not on file    Gets together: Not on file    Attends religious service: Not on file    Active member of club or organization: Not  on file    Attends meetings of clubs or organizations: Not on file    Relationship status: Not on file  Other Topics Concern  . Not on file  Social History Narrative  . Not on file   Additional Social History:                         Sleep: Good  Appetite:  Good  Current Medications: Current Facility-Administered Medications  Medication Dose Route Frequency Provider Last Rate Last Dose  . hydrOXYzine (ATARAX/VISTARIL) tablet 50 mg  50 mg Oral Q6H PRN Micheal Likensainville, Taesha Goodell T, MD      . LORazepam (ATIVAN) tablet  1 mg  1 mg Oral Q6H PRN Micheal Likens[place]ville, Elidia Bonenfant T, MD       Or  . LORazepam (ATIVAN) injection 1 mg  1 mg Intramuscular Q6H PRN Micheal Likens[place]ville, Bryauna Byrum T, MD      . mirtazapine (REMERON) tablet 15 mg  15 mg Oral QHS Micheal Likens[place], Keeyon Privitera T, MD   15 mg at 12/14/17 2204  . pregabalin (LYRICA) capsule 150 mg  150 mg Oral BID Rankin, Shuvon B, NP   150 mg at 12/15/17 0751  . risperiDONE (RISPERDAL M-TABS) disintegrating tablet 1 mg  1 mg Oral Q6H PRN Micheal Likens[place], Zyion Leidner T, MD       Or  . ziprasidone (GEODON) injection 20 mg  20 mg Intramuscular Q12H PRN Micheal Likens[place], Mills Mitton T, MD      . risperiDONE (RISPERDAL) tablet 1 mg  1 mg Oral BID Micheal Lik[place], Romond Pipkins T, MD   1 mg at 12/15/17 0751  . thyroid (ARMOUR) tablet 60 mg  60 mg Oral Daily Rankin, Shuvon B, NP   60 mg at 12/15/17 0751  . zolpidem (AMBIEN) tablet 5 mg  5 mg Oral QHS PRN Micheal Lik[place], Yoselin Amerman T, MD        Lab Results: No results found for this or any previous visit (from the past 48 hour(s)).  Blood Alcohol level:  Lab Results  Component Value Date   ETH <10 12/12/2017    Metabolic Disorder Labs: No results found for: HGBA1C, MPG Lab Results  Component Value Date   PROLACTIN 6.4 03/18/2014   Lab Results  Component Value Date   CHOL 186 02/06/2014   TRIG 82 02/06/2014   HDL 55 02/06/2014   CHOLHDL 3.4 02/06/2014   VLDL 16 02/06/2014   LDLCALC 115 (H) 02/06/2014    Physical Findings: AIMS: Facial and Oral Movements Muscles of Facial Expression: None, normal Lips and Perioral Area: None, normal Jaw: None, normal Tongue: None, normal,Extremity Movements Upper (arms, wrists, hands, fingers): None, normal Lower (legs, knees, ankles, toes): None, normal, Trunk Movements Neck, shoulders, hips: None, normal, Overall Severity Severity of abnormal movements (highest score from questions above): None, normal Incapacitation due to abnormal movements: None, normal Patient's awareness of abnormal movements (rate only  patient's report): No Awareness, Dental Status Current problems with teeth and/or dentures?: No Does patient usually wear dentures?: No  CIWA:    COWS:     Musculoskeletal: Strength & Muscle Tone: within normal limits Gait & Station: normal Patient leans: N/A  Psychiatric Specialty Exam: Physical Exam  Nursing note and vitals reviewed.   Review of Systems  Constitutional: Negative for chills and fever.  Respiratory: Negative for cough and shortness of breath.   Cardiovascular: Negative for chest pain.  Gastrointestinal: Negative for abdominal pain, heartburn, nausea and vomiting.  Psychiatric/Behavioral: Negative for depression, hallucinations and suicidal ideas. The patient is not  nervous/anxious and does not have insomnia.     Blood pressure 110/83, pulse (!) 101, temperature 98.4 F (36.9 C), temperature source Oral, resp. rate 18, height 5\' 6"  (1.676 m), weight 93 kg.Body mass index is 33.09 kg/m.  General Appearance: Casual and Fairly Groomed  Eye Contact:  Good  Speech:  Clear and Coherent and Normal Rate  Volume:  Normal  Mood:  Euthymic  Affect:  Appropriate and Congruent  Thought Process:  Coherent and Goal Directed  Orientation:  Full (Time, Place, and Person)  Thought Content:  Logical  Suicidal Thoughts:  No  Homicidal Thoughts:  No  Memory:  Immediate;   Fair Recent;   Fair Remote;   Fair  Judgement:  Fair  Insight:  Fair  Psychomotor Activity:  Normal  Concentration:  Concentration: Fair  Recall:  [employer] of Knowledge:  Fair  Language:  Fair  Akathisia:  No  Handed:    AIMS (if indicated):     Assets:  Resilience Social Support  ADL's:  Intact  Cognition:  WNL  Sleep:      Treatment Plan Summary: Daily contact with patient to assess and evaluate symptoms and progress in treatment and Medication management    -Continue inpatient hospitalization  -Bipolar I, singe manic episode with psychosis vs substance-induced psychotic disorder  (stimulant)   -Continue remeron 15mg  po qhs   -Continue risperdal 1mg  po BID  -hypothyroidism   -Continue armour thyroid qDay  -insomnia   -Continue ambien 5mg  po qhs prn insomnia  -fibromyalgia   -Continue lyrica 150mg  po BID  -agitation    -Continue ativan 1mg  po/IM q6h prn agitation  - Continue risperdal M-Tab 1mg  po q6h prn agitation    -Continue geodon 20mg  IM q12h prn severe agitation  -anxiety  -Continue vistaril 50mg  po q6h prn anxiety  -Encourage participation in groups and therapeutic milieu  -disposition planning will be ongoing  Micheal Likens, MD 12/15/2017, 2:57 PM

## 2017-12-15 NOTE — Progress Notes (Signed)
Adult Psychoeducational Group Note  Date:  12/15/2017 Time:  9:33 AM  Group Topic/Focus:  Goals Group:   The focus of this group is to help patients establish daily goals to achieve during treatment and discuss how the patient can incorporate goal setting into their daily lives to aide in recovery.  Participation Level:  Active  Participation Quality:  Appropriate  Affect:  Appropriate  Cognitive:  Appropriate  Insight: Appropriate  Engagement in Group:  Engaged  Modes of Intervention:  Discussion  Additional Comments:  Pt attended  group and listened too peers but did not share a goal.    Deforest Hoyleslexandre J Sherea Liptak 12/15/2017, 9:33 AM

## 2017-12-15 NOTE — [facility] Group Notes (Signed)
[facility] LCSW Group Therapy  12/15/2017  1:05 PM  Type of Therapy:  Group therapy  Participation Level:  Active  Participation Quality:  Attentive  Affect:  Flat  Cognitive:  Oriented  Insight:  Limited  Engagement in Therapy:  Limited  Modes of Intervention:  Discussion, Socialization  Summary of Progress/Problems:  Chaplain was here to lead a group on themes of hope and courage.  Ida Rogueorth, Amy Belloso B 12/15/2017 2:36 PM

## 2017-12-15 NOTE — Progress Notes (Signed)
Patient ID: Jenny Jennings, female   DOB: 06/17/1965, 52 y.o.   MRN: [id number] DAR Note: Pt appears very confused and anxious. Remained in room for more of the evening. Pt at assessment endorsed moderate anxiety some worrying. Pt denied suicidal thoughts, pain, auditory and visual hallucinations or depression. Support, encouragement, and safe environment provided. 15-minute safety checks continue. Pt was med compliant. Pt attended wrap-up group.

## 2017-12-16 DIAGNOSIS — F1290 Cannabis use, unspecified, uncomplicated: Secondary | ICD-10-CM

## 2017-12-16 DIAGNOSIS — G4700 Insomnia, unspecified: Secondary | ICD-10-CM

## 2017-12-16 DIAGNOSIS — R451 Restlessness and agitation: Secondary | ICD-10-CM

## 2017-12-16 DIAGNOSIS — M797 Fibromyalgia: Secondary | ICD-10-CM

## 2017-12-16 DIAGNOSIS — F419 Anxiety disorder, unspecified: Secondary | ICD-10-CM

## 2017-12-16 MED ORDER — NICOTINE 21 MG/24HR TD PT24
21.0000 mg | MEDICATED_PATCH | Freq: Every day | TRANSDERMAL | Status: DC
  Administered 2017-12-16 – 2017-12-18 (×3): 21 mg via TRANSDERMAL
  Filled 2017-12-16 (×5): qty 1

## 2017-12-16 MED ORDER — PSEUDOEPHEDRINE HCL 30 MG PO TABS
30.0000 mg | ORAL_TABLET | Freq: Three times a day (TID) | ORAL | Status: DC | PRN
  Administered 2017-12-17: 30 mg via ORAL
  Filled 2017-12-16: qty 1

## 2017-12-16 NOTE — Progress Notes (Signed)
[facility]  12/16/2017 2:08 PM Jenny Ipamela Jennings  MRN:  [id number]  Subjective: "I'm doing really well today. I have been up & about today. I'm taking the medicines, doing well on them. No side effects".  Jenny CloudPamela "Pam" Toney Jennings is a 52 y/o F with history of treatment for GAD and ADHD who was admitted on IVC initiated at [facility] after she presented there with her husband with bizarre behaviors and not eating or drinking for days. Head CT was unremarkable. Pt was agitated and bizarre in the ED. She had difficulty communicating. She was dancing in the hallways and required redirection several times. She was medically cleared and [facility]. She was started on trial of remeron and risperdal, and she was continued on home medication of lyrica while vyvanse, xanax, and phentermine were discontinued. Pt has been reporting incremental improvement of her presenting symptoms.  Today, Jenny Cloudamela is seen, chart reviewed. The chart findings discussed with the treatment team. She presents alert, oriented & aware of situation. She is visible on the unit, attending & participating in the group sessions. She says she is doing good. She reported improved mood. Pt denies other specific concerns. She is sleeping well. Her appetite is good. Her mood is good. She reports some minimal anxiety about worrying about her family, but she denies any component of fear that her family is dead as she had expressed on her intake interview. She denies SI/HI/AH/VH. She is tolerating her medications well, and she is in agreement to continue her current regimen without changes. She had no further questions, comments, or concerns.  Principal Problem: Bipolar I disorder, single manic episode, severe, with psychosis (HCC)  Diagnosis:   Patient Active Problem List   Diagnosis Date Noted  . 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 Bipolar I disorder, single manic episode, severe,  with psychosis (HCC) [F30.2] 12/14/2017  . Obesity [E66.9] 01/31/2014  . Excessive daytime sleepiness [G47.19] 01/31/2014  . Hypothyroidism [E03.9] 01/31/2014  . Annual physical exam [Z00.00] 01/31/2014   Total Time spent with patient: 15 minutes  Past Psychiatric History: See H&P  Past Medical History: History reviewed. No pertinent past medical history. History reviewed. No pertinent surgical history.  Family History: History reviewed. No pertinent family history.  Family Psychiatric  History: See H&P  Social History:  Social History   Substance and Sexual Activity  Alcohol Use Not Currently     Social History   Substance and Sexual Activity  Drug Use Yes  . Types: Marijuana    Social History   Socioeconomic History  . Marital status: Married    Spouse name: Not on file  . Number of children: Not on file  . Years of education: Not on file  . Highest education level: Not on file  Occupational History  . Not on file  Social Needs  . Financial resource strain: Not on file  . Food insecurity:    Worry: Not on file    Inability: Not on file  . Transportation needs:    Medical: Not on file    Non-medical: Not on file  Tobacco Use  . Smoking status: Current Every Day Smoker  . Smokeless tobacco: Never Used  Substance and Sexual Activity  . Alcohol use: Not Currently  . Drug use: Yes    Types: Marijuana  . Sexual activity: Yes  Lifestyle  . Physical activity:    Days per week: Not on file    Minutes per  session: Not on file  . Stress: Not on file  Relationships  . Social connections:    Talks on phone: Not on file    Gets together: Not on file    Attends religious service: Not on file    Active member of club or organization: Not on file    Attends meetings of clubs or organizations: Not on file    Relationship status: Not on file  Other Topics Concern  . Not on file  Social History Narrative  . Not on file   Additional Social History:   Sleep:  Good  Appetite:  Good  Current Medications: Current Facility-Administered Medications  Medication Dose Route Frequency Provider Last Rate Last Dose  . hydrOXYzine (ATARAX/VISTARIL) tablet 50 mg  50 mg Oral Q6H PRN Micheal Likens, MD      . LORazepam (ATIVAN) tablet 1 mg  1 mg Oral Q6H PRN Micheal Likens, MD       Or  . LORazepam (ATIVAN) injection 1 mg  1 mg Intramuscular Q6H PRN Micheal Likens, MD      . mirtazapine (REMERON) tablet 15 mg  15 mg Oral QHS Micheal Likens, MD   15 mg at 12/15/17 2138  . pregabalin (LYRICA) capsule 150 mg  150 mg Oral BID Rankin, Shuvon B, NP   150 mg at 12/16/17 0836  . risperiDONE (RISPERDAL M-TABS) disintegrating tablet 1 mg  1 mg Oral Q6H PRN Micheal Likens, MD       Or  . ziprasidone (GEODON) injection 20 mg  20 mg Intramuscular Q12H PRN Micheal Likens, MD      . risperiDONE (RISPERDAL) tablet 1 mg  1 mg Oral BID Micheal Likens, MD   1 mg at 12/16/17 0835  . thyroid (ARMOUR) tablet 60 mg  60 mg Oral Daily Rankin, Shuvon B, NP   60 mg at 12/16/17 0835  . zolpidem (AMBIEN) tablet 5 mg  5 mg Oral QHS PRN Micheal Likens, MD        Lab Results: No results found for this or any previous visit (from the past 48 hour(s)).  Blood Alcohol level:  Lab Results  Component Value Date   ETH <10 12/12/2017   Metabolic Disorder Labs: No results found for: HGBA1C, MPG Lab Results  Component Value Date   PROLACTIN 6.4 03/18/2014   Lab Results  Component Value Date   CHOL 186 02/06/2014   TRIG 82 02/06/2014   HDL 55 02/06/2014   CHOLHDL 3.4 02/06/2014   VLDL 16 02/06/2014   LDLCALC 115 (H) 02/06/2014    Physical Findings: AIMS: Facial and Oral Movements Muscles of Facial Expression: None, normal Lips and Perioral Area: None, normal Jaw: None, normal Tongue: None, normal,Extremity Movements Upper (arms, wrists, hands, fingers): None, normal Lower (legs, knees, ankles,  toes): None, normal, Trunk Movements Neck, shoulders, hips: None, normal, Overall Severity Severity of abnormal movements (highest score from questions above): None, normal Incapacitation due to abnormal movements: None, normal Patient's awareness of abnormal movements (rate only patient's report): No Awareness, Dental Status Current problems with teeth and/or dentures?: No Does patient usually wear dentures?: No  CIWA:    COWS:     Musculoskeletal: Strength & Muscle Tone: within normal limits Gait & Station: normal Patient leans: N/A  Psychiatric Specialty Exam: Physical Exam  Nursing note and vitals reviewed.   Review of Systems  Constitutional: Negative for chills and fever.  Respiratory: Negative for cough and shortness of breath.  Cardiovascular: Negative for chest pain.  Gastrointestinal: Negative for abdominal pain, heartburn, nausea and vomiting.  Psychiatric/Behavioral: Negative for depression, hallucinations and suicidal ideas. The patient is not nervous/anxious and does not have insomnia.     Blood pressure (!) 135/92, pulse 97, temperature 97.7 F (36.5 C), temperature source Oral, resp. rate 18, height 5\' 6"  (1.676 m), weight 93 kg.Body mass index is 33.09 kg/m.  General Appearance: Casual and Fairly Groomed  Eye Contact:  Good  Speech:  Clear and Coherent and Normal Rate  Volume:  Normal  Mood:  Euthymic  Affect:  Appropriate and Congruent  Thought Process:  Coherent and Goal Directed  Orientation:  Full (Time, Place, and Person)  Thought Content:  Logical  Suicidal Thoughts:  No  Homicidal Thoughts:  No  Memory:  Immediate;   Fair Recent;   Fair Remote;   Fair  Judgement:  Fair  Insight:  Fair  Psychomotor Activity:  Normal  Concentration:  Concentration: Fair  Recall:  [employer] of Knowledge:  Fair  Language:  Fair  Akathisia:  No  Handed:    AIMS (if indicated):     Assets:  Resilience Social Support  ADL's:  Intact  Cognition:  WNL  Sleep:   Number of Hours: 4.5   Treatment Plan Summary: Daily contact with patient to assess and evaluate symptoms and progress in treatment and Medication management   -Continue inpatient hospitalization.  -Will continue today 12/16/2017 plan as below except where it is noted.  -Bipolar I, singe manic episode with psychosis vs substance-induced psychotic disorder (stimulant)   -Continue remeron 15mg  po qhs   -Continue risperdal 1mg  po BID  -hypothyroidism   -Continue armour thyroid qDay  -insomnia   -Continue ambien 5mg  po qhs prn insomnia  -fibromyalgia   -Continue lyrica 150mg  po BID  -agitation    -Continue ativan 1mg  po/IM q6h prn agitation  -Continue risperdal M-Tab 1mg  po q6h prn agitation    -Continue geodon 20mg  IM q12h prn severe agitation  -anxiety  -Continue vistaril 50mg  po q6h prn anxiety  -Encourage participation in groups and therapeutic milieu  -disposition planning will be ongoing  Armandina Stammer, NP, PMHNP, FNP-BC. 12/16/2017, 2:08 PMPatient ID: Jenny Jennings, female   DOB: 09-19-1965, 52 y.o.   MRN: [id number]

## 2017-12-16 NOTE — [facility] Group Notes (Signed)
LCSW Group Therapy Note  12/16/2017    11:00am - 12:00pm  Type of Therapy and Topic:  Group Therapy: Anger and Coping Skills  Participation Level:  Active   Description of Group:   In this group, patients learned how to recognize the physical, cognitive, emotional, and behavioral responses they have to anger-provoking situations.  They identified how they usually or often react when angered, and learned how healthy and unhealthy coping skills work initially, but the unhealthy ones stop working.   They analyzed how their frequently-chosen coping skill is possibly beneficial and how it is possibly unhelpful.  The group discussed a variety of healthier coping skills that could help in resolving the actual issues, as well as how to go about planning for the the possibility of future similar situations.  Therapeutic Goals: 1. Patients will identify one thing that makes them angry and how they feel emotionally and physically, what their thoughts are or tend to be in those situations, and what healthy or unhealthy coping mechanism they typically use 2. Patients will identify how their coping technique works for them, as well as how it works against them. 3. Patients will explore possible new behaviors to use in future anger situations. 4. Patients will learn that anger itself is normal and cannot be eliminated, and that healthier coping skills can assist with resolving conflict rather than worsening situations.  Summary of Patient Progress:  The patient shared that she was "angry at a level of 9 out of 10" recently and often when her children don't clean their rooms.  She also said her children know "how to push my buttons and that I'm a pushover.  She said she will yell and curse and that is the only thing that makes them listen.  Another patient told her how it makes him feel when his parents deal with him calm, and encouraged her to do the same.  She was coughing/sneezing and had to leave  group.  Therapeutic Modalities:   Cognitive Behavioral Therapy Motivation Interviewing  Lynnell ChadMareida J Grossman-Orr

## 2017-12-16 NOTE — [facility] Group Notes (Signed)
Adult Psychoeducational Group Note  Date:  12/16/2017 Time:  10:33 AM  Group Topic/Focus:  Building Self Esteem:   The Focus of this group is helping patients become aware of the effects of self-esteem on their lives, the things they and others do that enhance or undermine their self-esteem, seeing the relationship between their level of self-esteem and the choices they make and learning ways to enhance self-esteem.  Participation Level:  Active  Participation Quality:  Appropriate  Affect:  Appropriate  Cognitive:  Appropriate  Insight: Appropriate and Good  Engagement in Group:  Engaged  Modes of Intervention:  Discussion and Socialization  Additional Comments:    Chauncey FischerRobinson, Kennedey Digilio Long 12/16/2017, 10:33 AM

## 2017-12-16 NOTE — [facility] Group Notes (Signed)
[facility] Group Notes:  (Nursing/MHT/Case Management/Adjunct)  Date:  12/16/2017  Time:  3:21 PM  Type of Therapy:  Psychoeducational Skills  Participation Level:  Active  Participation Quality:  Appropriate  Affect:  Appropriate  Cognitive:  Appropriate  Insight:  Appropriate  Engagement in Group:  Engaged  Modes of Intervention:  Problem-solving  Summary of Progress/Problems:  Topic was on anger management.  Bethann PunchesJane O Jobie Popp 12/16/2017, 3:21 PM

## 2017-12-16 NOTE — Progress Notes (Signed)
Adult Psychoeducational Group Note  Date:  12/16/2017 Time:  8:45 PM  Group Topic/Focus:  Wrap-Up Group:   The focus of this group is to help patients review their daily goal of treatment and discuss progress on daily workbooks.  Participation Level:  Active  Participation Quality:  Appropriate  Affect:  Appropriate  Cognitive:  Appropriate  Insight: Appropriate  Engagement in Group:  Engaged  Modes of Intervention:  Discussion  Additional Comments: The patient expressed that she rates today a 9.The patient also said that she attended group.  Octavio Mannshigpen, Chinedum Vanhouten Lee 12/16/2017, 8:45 PM

## 2017-12-16 NOTE — Progress Notes (Signed)
Patient ID: Jenny Jennings, female   DOB: 01/24/1966, 52 y.o.   MRN: [id number] DAR Note: Pt appears very confused and anxious. Pt remained isolated to her room all evening. Pt at assessment denied any anxiety, depression, SI/HI, pain or AVH; "I think I will be going home in the morning; I came here because I was stressed but I feel better now." Support, encouragement, and safe environment provided. 15-minute safety checks continue. Pt was med compliant. Pt did not attend wrap-up group.

## 2017-12-17 MED ORDER — LORATADINE 10 MG PO TABS
10.0000 mg | ORAL_TABLET | Freq: Every day | ORAL | Status: DC
  Administered 2017-12-17 – 2017-12-18 (×2): 10 mg via ORAL
  Filled 2017-12-17 (×5): qty 1

## 2017-12-17 NOTE — Progress Notes (Signed)
[facility] MD Progress Note  12/17/2017 3:40 PM Jenny Ipamela Jennings  MRN:  [id number]  Subjective: "I'm doing well today. I think I'm ready to get out of here. My family needs me. I feel great".  Jenny Jennings is a 52 y/o F with history of treatment for GAD and ADHD who was admitted on IVC initiated at [facility] after she presented there with her husband with bizarre behaviors and not eating or drinking for days. Head CT was unremarkable. Pt was agitated and bizarre in the ED. She had difficulty communicating. She was dancing in the hallways and required redirection several times. She was medically cleared and [facility] and stabilization. She was started on trial of remeron and risperdal, and she was continued on home medication of lyrica while vyvanse, xanax, and phentermine were discontinued. Pt has been reporting incremental improvement of her presenting symptoms.  Today, Jenny Jennings is seen, chart reviewed. The chart findings discussed with the treatment team. She presents alert, oriented & aware of situation. She is visible on the unit, attending & participating in the group sessions. She says she is doing great. She reported improved mood. She says she has a good visit with husband yesterday evening. She says her husband says he can see that she has improved quite much & was sorry that he did not realize how much she was going through prior to this. She says she is ready to be discharged because her family needs her. She says she was weaning herself off of her counseling responsibilities prior to being hospitalized. She believes that she will not be going back to work for a while. Pt denies other specific concerns. She is sleeping well. Her appetite is good. Her mood is good. She reports no anxiety feelings today. Her affect is bright & reported good energy. She denies SI/HI/AH/VH. She is tolerating her medications well, and she is in agreement to continue her current regimen  without changes. She had no further questions, comments, or concerns.  Principal Problem: Bipolar I disorder, single manic episode, severe, with psychosis (HCC)  Diagnosis:   Patient Active Problem List   Diagnosis Date Noted  . 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 Bipolar I disorder, single manic episode, severe, with psychosis (HCC) [F30.2] 12/14/2017  . Obesity [E66.9] 01/31/2014  . Excessive daytime sleepiness [G47.19] 01/31/2014  . Hypothyroidism [E03.9] 01/31/2014  . Annual physical exam [Z00.00] 01/31/2014   Total Time spent with patient: 15 minutes  Past Psychiatric History: See H&P  Past Medical History: History reviewed. No pertinent past medical history. History reviewed. No pertinent surgical history.  Family History: History reviewed. No pertinent family history.  Family Psychiatric  History: See H&P  Social History:  Social History   Substance and Sexual Activity  Alcohol Use Not Currently     Social History   Substance and Sexual Activity  Drug Use Yes  . Types: Marijuana    Social History   Socioeconomic History  . Marital status: Married    Spouse name: Not on file  . Number of children: Not on file  . Years of education: Not on file  . Highest education level: Not on file  Occupational History  . Not on file  Social Needs  . Financial resource strain: Not on file  . Food insecurity:    Worry: Not on file    Inability: Not on file  . Transportation needs:    Medical: Not on file    Non-medical: Not  on file  Tobacco Use  . Smoking status: Current Every Day Smoker  . Smokeless tobacco: Never Used  Substance and Sexual Activity  . Alcohol use: Not Currently  . Drug use: Yes    Types: Marijuana  . Sexual activity: Yes  Lifestyle  . Physical activity:    Days per week: Not on file    Minutes per session: Not on file  . Stress: Not on file  Relationships  . Social connections:    Talks on phone: Not on file    Gets together: Not  on file    Attends religious service: Not on file    Active member of club or organization: Not on file    Attends meetings of clubs or organizations: Not on file    Relationship status: Not on file  Other Topics Concern  . Not on file  Social History Narrative  . Not on file   Additional Social History:   Sleep: Good  Appetite:  Good  Current Medications: Current Facility-Administered Medications  Medication Dose Route Frequency Provider Last Rate Last Dose  . hydrOXYzine (ATARAX/VISTARIL) tablet 50 mg  50 mg Oral Q6H PRN Micheal Likens, MD      . loratadine (CLARITIN) tablet 10 mg  10 mg Oral Daily Antonieta Pert, MD   10 mg at 12/17/17 1610  . LORazepam (ATIVAN) tablet 1 mg  1 mg Oral Q6H PRN Micheal Likens, MD       Or  . LORazepam (ATIVAN) injection 1 mg  1 mg Intramuscular Q6H PRN Micheal Likens, MD      . mirtazapine (REMERON) tablet 15 mg  15 mg Oral QHS Micheal Likens, MD   15 mg at 12/16/17 2147  . nicotine (NICODERM CQ - dosed in mg/24 hours) patch 21 mg  21 mg Transdermal Daily Armandina Stammer I, NP   21 mg at 12/17/17 0804  . pregabalin (LYRICA) capsule 150 mg  150 mg Oral BID Rankin, Shuvon B, NP   150 mg at 12/17/17 0804  . pseudoephedrine (SUDAFED) tablet 30 mg  30 mg Oral Q8H PRN Nwoko, Agnes I, NP      . risperiDONE (RISPERDAL M-TABS) disintegrating tablet 1 mg  1 mg Oral Q6H PRN Micheal Likens, MD       Or  . ziprasidone (GEODON) injection 20 mg  20 mg Intramuscular Q12H PRN Micheal Likens, MD      . risperiDONE (RISPERDAL) tablet 1 mg  1 mg Oral BID Micheal Likens, MD   1 mg at 12/17/17 0803  . thyroid (ARMOUR) tablet 60 mg  60 mg Oral Daily Rankin, Shuvon B, NP   60 mg at 12/17/17 0803  . zolpidem (AMBIEN) tablet 5 mg  5 mg Oral QHS PRN Micheal Likens, MD       Lab Results: No results found for this or any previous visit (from the past 48 hour(s)).  Blood Alcohol level:  Lab  Results  Component Value Date   ETH <10 12/12/2017   Metabolic Disorder Labs: No results found for: HGBA1C, MPG Lab Results  Component Value Date   PROLACTIN 6.4 03/18/2014   Lab Results  Component Value Date   CHOL 186 02/06/2014   TRIG 82 02/06/2014   HDL 55 02/06/2014   CHOLHDL 3.4 02/06/2014   VLDL 16 02/06/2014   LDLCALC 115 (H) 02/06/2014   Physical Findings: AIMS: Facial and Oral Movements Muscles of Facial Expression: None, normal Lips and Perioral  Area: None, normal Jaw: None, normal Tongue: None, normal,Extremity Movements Upper (arms, wrists, hands, fingers): None, normal Lower (legs, knees, ankles, toes): None, normal, Trunk Movements Neck, shoulders, hips: None, normal, Overall Severity Severity of abnormal movements (highest score from questions above): None, normal Incapacitation due to abnormal movements: None, normal Patient's awareness of abnormal movements (rate only patient's report): No Awareness, Dental Status Current problems with teeth and/or dentures?: No Does patient usually wear dentures?: No  CIWA:  CIWA-Ar Total: 1 COWS:  COWS Total Score: 2  Musculoskeletal: Strength & Muscle Tone: within normal limits Gait & Station: normal Patient leans: N/A  Psychiatric Specialty Exam: Physical Exam  Nursing note and vitals reviewed.   Review of Systems  Constitutional: Negative for chills and fever.  Respiratory: Negative for cough and shortness of breath.   Cardiovascular: Negative for chest pain.  Gastrointestinal: Negative for abdominal pain, heartburn, nausea and vomiting.  Psychiatric/Behavioral: Negative for depression, hallucinations and suicidal ideas. The patient is not nervous/anxious and does not have insomnia.     Blood pressure (!) 139/95, pulse 97, temperature 97.8 F (36.6 C), resp. rate 16, height 5\' 6"  (1.676 m), weight 93 kg.Body mass index is 33.09 kg/m.  General Appearance: Casual and Fairly Groomed  Eye Contact:  Good   Speech:  Clear and Coherent and Normal Rate  Volume:  Normal  Mood:  Euthymic  Affect:  Appropriate and Congruent  Thought Process:  Coherent and Goal Directed  Orientation:  Full (Time, Place, and Person)  Thought Content:  Logical  Suicidal Thoughts:  No  Homicidal Thoughts:  No  Memory:  Immediate;   Fair Recent;   Fair Remote;   Fair  Judgement:  Fair  Insight:  Fair  Psychomotor Activity:  Normal  Concentration:  Concentration: Fair  Recall:  [employer] of Knowledge:  Fair  Language:  Fair  Akathisia:  No  Handed:    AIMS (if indicated):     Assets:  Resilience Social Support  ADL's:  Intact  Cognition:  WNL  Sleep:  Number of Hours: 6.75   Treatment Plan Summary: Daily contact with patient to assess and evaluate symptoms and progress in treatment and Medication management   -Continue inpatient hospitalization.  -Will continue today 12/17/2017 plan as below except where it is noted.  -Bipolar I, singe manic episode with psychosis vs substance-induced psychotic disorder (stimulant)   -Continue remeron 15mg  po qhs   -Continue risperdal 1mg  po BID  -hypothyroidism   -Continue armour thyroid qDay  -insomnia   -Continue ambien 5mg  po qhs prn insomnia  -fibromyalgia   -Continue lyrica 150mg  po BID  -agitation    -Continue ativan 1mg  po/IM q6h prn agitation  -Continue risperdal M-Tab 1mg  po q6h prn agitation    -Continue geodon 20mg  IM q12h prn severe agitation  -anxiety  -Continue vistaril 50mg  po q6h prn anxiety  -Encourage participation in groups and therapeutic milieu  -disposition planning will be ongoing  Armandina Stammer, NP, PMHNP, FNP-BC. 12/17/2017, 3:40 PMPatient ID: Jenny Ip, female   DOB: Oct 19, 1965, 52 y.o.   MRN: [id number]

## 2017-12-17 NOTE — [facility] Group Notes (Signed)
[facility]  Date/Time:  12/17/2017  11:00AM-12:00PM  Type of Therapy and Topic:  Group Therapy:  Music and Mood  Participation Level:  Active   Description of Group: In this process group, members listened to a variety of genres of music and identified that different types of music evoke different responses.  Patients were encouraged to identify music that was soothing for them and music that was energizing for them.  Patients discussed how this knowledge can help with wellness and recovery in various ways including managing depression and anxiety as well as encouraging healthy sleep habits.    Therapeutic Goals: 1. Patients will explore the impact of different varieties of music on mood 2. Patients will verbalize the thoughts they have when listening to different types of music 3. Patients will identify music that is soothing to them as well as music that is energizing to them 4. Patients will discuss how to use this knowledge to assist in maintaining wellness and recovery 5. Patients will explore the use of music as a coping skill  Summary of Patient Progress:  At the beginning of group, patient expressed that she felt good and at the end of group said she felt relaxed.  She danced and sang a lot, obviously enjoyed the group time.  Therapeutic Modalities: Solution Focused Brief Therapy Activity   Ambrose MantleMareida Grossman-Orr, LCSW

## 2017-12-17 NOTE — Plan of Care (Signed)
Nurse discussed anxiety, depression, coping skills with patient. 

## 2017-12-17 NOTE — Progress Notes (Signed)
Adult Psychoeducational Group Note  Date:  12/17/2017 Time:  8:46 PM  Group Topic/Focus:  Wrap-Up Group:   The focus of this group is to help patients review their daily goal of treatment and discuss progress on daily workbooks.  Participation Level:  Active  Participation Quality:  Appropriate  Affect:  Appropriate  Cognitive:  Appropriate  Insight: Appropriate  Engagement in Group:  Engaged  Modes of Intervention:  Discussion  Additional Comments:The patient expressed that she rates today a 10.the patient also said that she attended groups.  Octavio Mannshigpen, Eden Toohey Lee 12/17/2017, 8:46 PM

## 2017-12-17 NOTE — Progress Notes (Signed)
D:  Patient's self inventory sheet, patient sleeps good, no sleep medication given.  Good appetite, normal energy level, good concentration.  Denied depression and hopeless.  Rated anxiety #3.  Denied withdrawals.  Denied SI.  Denied physical problems.  Denied physical pain.  Goal is make sure she has correct meds after discharge.  Plans to attend activities and stay relaxed and calm.  Does have discharge plans. A:  Medications administered per MD orders.  Emotional support and encouragement given patient. R:  Denied SI and HI, contracts for safety.  Denied A/V hallucinations.  Safety maintained with 15 minute checks.

## 2017-12-17 NOTE — Progress Notes (Signed)
Patient ID: Jenny Jennings, female   DOB: 07/29/1965, 52 y.o.   MRN: [id number] DAR Note: Pt observed in the dayroom interacting with peers. Pt appears to be still a little confused. Pt at assessment denied any anxiety, depression, SI, HI, AVH or pain; "I know I fill much better today that when I came in." Pt was med compliant. All patient's questions and concerns addressed. Support, encouragement, and safe environment provide. Pt attended PM group meeting.

## 2017-12-17 NOTE — [facility] Group Notes (Signed)
[facility] Group Notes:  (Nursing/MHT/Case Management/Adjunct)  Date:  12/17/2017  Time:  9:16 AM  Type of Therapy:  Goals/Orientation Group.  Participation Level:  Active  Participation Quality:  Appropriate  Affect:  Appropriate  Cognitive:  Appropriate  Insight:  Appropriate  Engagement in Group:  Engaged  Modes of Intervention:  Discussion  Summary of Progress/Problems: Pt attended goals/orientation group, pt was receptive.   Jenny Jennings 12/17/2017, 9:16 AM 

## 2017-12-17 NOTE — [facility] Group Notes (Signed)
[facility] Group Notes:  (Nursing/MHT/Case Management/Adjunct)  Date:  12/17/2017  Time:  11:23 AM  Type of Therapy:  Psychoeducational Skills  Participation Level:  Active  Participation Quality:  Appropriate  Affect:  Appropriate  Cognitive:  Appropriate  Insight:  Appropriate  Engagement in Group:  Engaged  Modes of Intervention:  Problem-solving  Summary of Progress/Problems: Pt attended Psychoeducational group with topic being healthy support systems.   Jenny Jennings 12/17/2017, 11:23 AM 

## 2017-12-18 MED ORDER — RISPERIDONE 1 MG PO TABS: 1.0000 mg | ORAL_TABLET | Freq: Two times a day (BID) | ORAL | 0 refills | Status: AC

## 2017-12-18 MED ORDER — HYDROXYZINE HCL 50 MG PO TABS: 50.0000 mg | ORAL_TABLET | Freq: Four times a day (QID) | ORAL | 0 refills | Status: AC | PRN

## 2017-12-18 MED ORDER — NICOTINE 21 MG/24HR TD PT24: 21.0000 mg | MEDICATED_PATCH | Freq: Every day | TRANSDERMAL | 0 refills | Status: AC

## 2017-12-18 MED ORDER — MIRTAZAPINE 15 MG PO TABS: 15.0000 mg | ORAL_TABLET | Freq: Every day | ORAL | 0 refills | Status: AC

## 2017-12-18 NOTE — Progress Notes (Signed)
  [facility] :  Will you be returning to the same living situation after discharge:  Yes,  home At discharge, do you have transportation home?: Yes,  husband Do you have the ability to pay for your medications: Yes,  insurance  Release of information consent forms completed and in the chart;  Patient's signature needed at discharge.  Patient to Follow up at: Follow-up Information    [facility]   Why:  Please call no later than 12/20/17, to hold appointment and pay $20 deposit, for 01/01/18 Screening Appointment. Hospital follow up appointment for Medication Management, 215pm on 01/02/18, with NP Eilene Ghaziliff Harper Contact information: [address] [place] [id number] [phone]           Next level of care provider has access to [facility] Link:no  Safety Planning and Suicide Prevention discussed: Yes,  yes  Have you used any form of tobacco in the last 30 days? (Cigarettes, Smokeless Tobacco, Cigars, and/or Pipes): Yes  Has patient been referred to the Quitline?: Patient refused referral  Patient has been referred for addiction treatment: N/A  Ida RogueRodney B Bebe Moncure, LCSW 12/18/2017, 10:10 AM

## 2017-12-18 NOTE — [facility] Suicide Risk Assessment (Signed)
[facility]   Principal Problem: Bipolar I disorder, single manic episode, severe, with psychosis [facility](HCC) Discharge Diagnoses:  Patient Active Problem List   Diagnosis Date Noted  . 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 Bipolar I disorder, single manic episode, severe, with psychosis (HCC) [F30.2] 12/14/2017  . Obesity [E66.9] 01/31/2014  . Excessive daytime sleepiness [G47.19] 01/31/2014  . Hypothyroidism [E03.9] 01/31/2014  . Annual physical exam [Z00.00] 01/31/2014    Total Time spent with patient: 30 minutes  Musculoskeletal: Strength & Muscle Tone: within normal limits Gait & Station: normal Patient leans: N/A  Psychiatric Specialty Exam: Review of Systems  Constitutional: Negative for chills and fever.  Respiratory: Negative for cough and shortness of breath.   Cardiovascular: Negative for chest pain.  Gastrointestinal: Negative for abdominal pain, heartburn, nausea and vomiting.  Psychiatric/Behavioral: Negative for depression, hallucinations and suicidal ideas. The patient is not nervous/anxious and does not have insomnia.     Blood pressure (!) 142/107, pulse 92, temperature 97.9 F (36.6 C), temperature source Oral, resp. rate 16, height 5\' 6"  (1.676 m), weight 93 kg.Body mass index is 33.09 kg/m.  General Appearance: Casual and Fairly Groomed  Patent attorneyye Contact::  Good  Speech:  Clear and Coherent and Normal Rate  Volume:  Normal  Mood:  Euthymic  Affect:  Appropriate and Congruent  Thought Process:  Coherent and Goal Directed  Orientation:  Full (Time, Place, and Person)  Thought Content:  Logical  Suicidal Thoughts:  No  Homicidal Thoughts:  No  Memory:  Immediate;   Fair Recent;   Fair Remote;   Fair  Judgement:  Fair  Insight:  Fair  Psychomotor Activity:  Normal  Concentration:  Fair  Recall:  [employer]  Fund of Knowledge:Fair  Language: Fair  Akathisia:  No  Handed:    AIMS (if indicated):     Assets:  Resilience Social  Support  Sleep:  Number of Hours: 5  Cognition: WNL  ADL's:  Intact   Mental Status Per Nursing Assessment::   On Admission:  NA  Demographic Factors:  Caucasian and Unemployed  Loss Factors: Financial problems/change in socioeconomic status  Historical Factors: Impulsivity  Risk Reduction Factors:   Responsible for children under 50 years of age, Sense of responsibility to family, Living with another person, especially a relative, Positive social support, Positive therapeutic relationship and Positive coping skills or problem solving skills  Continued Clinical Symptoms:  Severe Anxiety and/or Agitation Bipolar Disorder:   Mixed State  Cognitive Features That Contribute To Risk:  None    Suicide Risk:  Minimal: No identifiable suicidal ideation.  Patients presenting with no risk factors but with morbid ruminations; may be classified as minimal risk based on the severity of the depressive symptoms  Follow-up Information    [facility].   Why:  Please call no later than 12/20/17, to hold appointment and pay $20 deposit, for 01/01/18 Screening Appointment. Hospital follow up appointment for Medication Management, 215pm on 01/02/18, with NP Eilene Ghaziliff Harper Contact information: [address] [place] [id number] [phone]         Subjective Data:  Jenny Jennings is a 52 y/o F with history of treatment for GAD and ADHD who was admitted on IVC initiated at [facility] after she presented there with her husband with bizarre behaviors and not eating or drinking for days. Head CT was unremarkable. Pt was agitated and bizarre in the ED. She had difficulty communicating. She was dancing in  the hallways and required redirection several times. She was medically cleared and then transferred to [facility] for additional treatment and stabilization. She was started on trial of remeron and risperdal, and she was continued on home medication of lyrica while vyvanse,  xanax, and phentermine were discontinued. Pt has been reporting incremental improvement of her presenting symptoms.  Today upon evaluation, pt shares, "I'm doing good." She denies any specific concerns. She is sleeping well. Her appetite is good. She denies physical complaints. She denies SI/HI/AH/VH. She denies paranoia. She is tolerating her medications well, and she denies side effects. She is in agreement to continue her current regimen without changes. She plans to [facility] up at [facility]. She was able to engage in safety planning including plan to return to [facility] or contact emergency services if she feels unable to maintain her own safety or the safety of others. Pt had no further questions, comments, or concerns.   Plan Of Care/Follow-up recommendations:   -Discharge to outpatient level of care  -Bipolar I, singe manic episode with psychosis vs substance-induced psychotic disorder (stimulant)             -Continue remeron 15mg  po qhs             -Continue risperdal 1mg  po BID  -hypothyroidism             -Continue armour thyroid qDay  -insomnia             -Continue ambien 5mg  po qhs prn insomnia  -fibromyalgia             -Continue lyrica 150mg  po BID  -anxiety             -Continue vistaril 50mg  po q6h prn anxiety  Activity:  as tolerated Diet:  normal Tests:  NA Other:  see above for DC plan  Micheal Likens, MD 12/18/2017, 11:08 AM

## 2017-12-18 NOTE — Progress Notes (Signed)
Recreation Therapy Notes  INPATIENT RECREATION TR PLAN  Patient Details Name: Jenny Jennings MRN: [id number] DOB: [birth year] Today's Date: 12/18/2017  Rec Therapy Plan Is patient appropriate for Therapeutic Recreation?: Yes Treatment times per week: about 3 days Estimated Length of Stay: 5-7 days TR Treatment/Interventions: Group participation (Comment)  Discharge Criteria Pt will be discharged from therapy if:: Discharged Treatment plan/goals/alternatives discussed and agreed upon by:: Patient/family  Discharge Summary Short term goals set: See patient care plan Short term goals met: Adequate for discharge Progress toward goals comments: Groups attended Which groups?: Wellness Reason goals not met: None Therapeutic equipment acquired: N/A Reason patient discharged from therapy: Discharge from hospital Pt/family agrees with progress & goals achieved: Yes Date patient discharged from therapy: 12/18/17    Victorino Sparrow, LRT/CTRS  Ria Comment, Caisen Mangas A 12/18/2017, 1:00 PM

## 2017-12-18 NOTE — Plan of Care (Signed)
Pt was able to identify some coping skills at completion of recreation therapy group sessions.   Aubreana Cornacchia, LRT/CTRS 

## 2017-12-18 NOTE — Discharge Summary (Addendum)
Physician Discharge Summary Note  Patient:  Jenny Jennings is an 52 y.o., female MRN:  [id number] DOB:  [birth year] Patient phone:  [phone] (home)  Patient address:   [address] [place] [address],  Total Time spent with patient: 20 minutes  Date of Admission:  12/13/2017 Date of Discharge: 12/18/2017  Reason for Admission: Per assessment note: Jenny Jennings is a 52 y/o F with history of treatment for GAD and ADHD who was admitted on IVC initiated at [facility] after she presented there with her husband with bizarre behaviors and not eating or drinking for days. Head CT was unremarkable. Pt was agitated and bizarre in the ED. She had difficulty communicating. She was dancing in the hallways and required redirection several times. She was medically cleared and then transferred to [facility] for additional treatment and stabilization.Upon initial interview, pt shares, "I think my husband brought me in because I'm under a lot of stress. I needed to go to sleep." Pt reports stressor of recently her mother was diagnose with Alzheimer's disease. Pt continues, "My brain - I feel like a I need to crack a code. I don't feel safe." Pt endorses paranoia and she is concerned that her husband and parents have been killed. She endorses fluctuant sleep pattern and decreased appetite, but she denies other symptoms of depression. She endorses distractibility, increased activities, flight of ideas, decreased need for sleep with little to no sleep for 2 days, some mildly grandiose thoughts that she should be living in a mansion, and some reckless spending. She denies SI/HI/AH/VH. She denies symptoms of OCD and PTSD. She reports using alcohol in binge pattern about twice per year, using alcohol about twice per week, and she smokes 1.5 ppd. She denies other substance use.Discussed with patient about treatment options. She takes lyrica, vyvanse, xanax 0.25mg  at bedtime, and phentermine at home. We discussed continuing  lyrica but stopping her other home psychotropic medications and starting trial of remeron and risperdal, and pt was in agreement.   Principal Problem: Bipolar I disorder, single manic episode, severe, with psychosis [facility]) Discharge Diagnoses: Patient Active Problem List   Diagnosis Date Noted  . 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 Bipolar I disorder, single manic episode, severe, with psychosis (HCC) [F30.2] 12/14/2017  . Obesity [E66.9] 01/31/2014  . Excessive daytime sleepiness [G47.19] 01/31/2014  . Hypothyroidism [E03.9] 01/31/2014  . Annual physical exam [Z00.00] 01/31/2014    Past Psychiatric History:  Past Medical History: History reviewed. No pertinent past medical history. History reviewed. No pertinent surgical history. Family History: History reviewed. No pertinent family history. Family Psychiatric  History: Social History:  Social History   Substance and Sexual Activity  Alcohol Use Not Currently     Social History   Substance and Sexual Activity  Drug Use Yes  . Types: Marijuana    Social History   Socioeconomic History  . Marital status: Married    Spouse name: Not on file  . Number of children: Not on file  . Years of education: Not on file  . Highest education level: Not on file  Occupational History  . Not on file  Social Needs  . Financial resource strain: Not on file  . Food insecurity:    Worry: Not on file    Inability: Not on file  . Transportation needs:    Medical: Not on file    Non-medical: Not on file  Tobacco Use  . Smoking status: Current Every Day Smoker  . Smokeless  tobacco: Never Used  Substance and Sexual Activity  . Alcohol use: Not Currently  . Drug use: Yes    Types: Marijuana  . Sexual activity: Yes  Lifestyle  . Physical activity:    Days per week: Not on file    Minutes per session: Not on file  . Stress: Not on file  Relationships  . Social connections:    Talks on phone: Not on file    Gets together:  Not on file    Attends religious service: Not on file    Active member of club or organization: Not on file    Attends meetings of clubs or organizations: Not on file    Relationship status: Not on file  Other Topics Concern  . Not on file  Social History Narrative  . Not on file    Hospital Course:  Jenny Jennings was admitted for Bipolar I disorder, single manic episode, severe, with psychosis (HCC) and crisis management.  Pt was treated discharged with the medications listed below under Medication List.  Medical problems were identified and treated as needed.  Home medications were restarted as appropriate.  Improvement was monitored by observation and Jenny Jennings 's daily report of symptom reduction.  Emotional and mental status was monitored by daily self-inventory reports completed by Jenny Jennings and clinical staff.         Jenny Jennings was evaluated by the treatment team for stability and plans for continued recovery upon discharge. Jenny Jennings 's motivation was an integral factor for scheduling further treatment. Employment, transportation, bed availability, health status, family support, and any pending legal issues were also considered during hospital stay. Pt was offered further treatment options upon discharge including but not limited to Residential, Intensive Outpatient, and Outpatient treatment.  Jenny Jennings will follow up with the services as listed below under Follow Up Information.     Upon completion of this admission the patient was both mentally and medically stable for discharge denying suicidal/homicidal ideation, auditory/visual/tactile hallucinations, delusional thoughts and paranoia.    Jenny Jennings responded well to treatment with Lyrica 150 mg and Risperdal 1 mg BID and Remeron 15 mg without adverse effects.. Pt demonstrated improvement without reported or observed adverse effects to the point of stability appropriate for outpatient management. Pertinent  labs include: Benzodiazepine, Amphetamines and Thc for which outpatient follow-up is necessary for lab recheck as mentioned below. Reviewed CBC, CMP, BAL, and UDS; all unremarkable aside from noted exceptions.   Physical Findings: AIMS: Facial and Oral Movements Muscles of Facial Expression: None, normal Lips and Perioral Area: None, normal Jaw: None, normal Tongue: None, normal,Extremity Movements Upper (arms, wrists, hands, fingers): None, normal Lower (legs, knees, ankles, toes): None, normal, Trunk Movements Neck, shoulders, hips: None, normal, Overall Severity Severity of abnormal movements (highest score from questions above): None, normal Incapacitation due to abnormal movements: None, normal Patient's awareness of abnormal movements (rate only patient's report): No Awareness, Dental Status Current problems with teeth and/or dentures?: No Does patient usually wear dentures?: No  CIWA:  CIWA-Ar Total: 1 COWS:  COWS Total Score: 2  Musculoskeletal: Strength & Muscle Tone: within normal limits Gait & Station: normal Patient leans: N/A  Psychiatric Specialty Exam: See SRA by MD  Physical Exam  Nursing note and vitals reviewed. Constitutional: She appears well-developed.  Neurological: She is alert.  Psychiatric: She has a normal mood and affect. Her behavior is normal.    Review of Systems  Psychiatric/Behavioral: Negative for depression and suicidal ideas. The patient  is not nervous/anxious. Insomnia: improving    All other systems reviewed and are negative.   Blood pressure (!) 142/107, pulse 92, temperature 97.9 F (36.6 C), temperature source Oral, resp. rate 16, height 5\' 6"  (1.676 m), weight 93 kg.Body mass index is 33.09 kg/m.   Have you used any form of tobacco in the last 30 days? (Cigarettes, Smokeless Tobacco, Cigars, and/or Pipes): Yes  Has this patient used any form of tobacco in the last 30 days? (Cigarettes, Smokeless Tobacco, Cigars, and/or Pipes) Yes, Yes,  A prescription for an FDA-approved tobacco cessation medication was offered at discharge and the patient refused  Blood Alcohol level:  Lab Results  Component Value Date   ETH <10 12/12/2017    Metabolic Disorder Labs:  No results found for: HGBA1C, MPG Lab Results  Component Value Date   PROLACTIN 6.4 03/18/2014   Lab Results  Component Value Date   CHOL 186 02/06/2014   TRIG 82 02/06/2014   HDL 55 02/06/2014   CHOLHDL 3.4 02/06/2014   VLDL 16 02/06/2014   LDLCALC 115 (H) 02/06/2014    See Psychiatric Specialty Exam and Suicide Risk Assessment completed by Attending Physician prior to discharge.  Discharge destination:  Home  Is patient on multiple antipsychotic therapies at discharge:  No   Has Patient had three or more failed trials of antipsychotic monotherapy by history:  No  Recommended Plan for Multiple Antipsychotic Therapies: NA  Discharge Instructions    Diet - low sodium heart healthy   Complete by:  As directed    Discharge instructions   Complete by:  As directed    Take all medications as prescribed. Keep all follow-up appointments as scheduled.  Do not consume alcohol or use illegal drugs while on prescription medications. Report any adverse effects from your medications to your primary care provider promptly.  In the event of recurrent symptoms or worsening symptoms, call 911, a crisis hotline, or go to the nearest emergency department for evaluation.   Increase activity slowly   Complete by:  As directed      Allergies as of 12/18/2017   No Known Allergies     Medication List    STOP taking these medications   FLUoxetine 40 MG capsule Commonly known as:  PROZAC   ibuprofen 800 MG tablet Commonly known as:  ADVIL,MOTRIN   pregabalin 150 MG capsule Commonly known as:  LYRICA   VYVANSE 40 MG capsule Generic drug:  lisdexamfetamine     TAKE these medications     Indication  ALPRAZolam 0.5 MG tablet Commonly known as:  XANAX Take 0.5  mg by mouth at bedtime as needed for sleep.  Indication:  Feeling Anxious   hydrOXYzine 50 MG tablet Commonly known as:  ATARAX/VISTARIL Take 1 tablet (50 mg total) by mouth every 6 (six) hours as needed for anxiety.  Indication:  Feeling Anxious, Pain   mirtazapine 15 MG tablet Commonly known as:  REMERON Take 1 tablet (15 mg total) by mouth at bedtime.  Indication:  Major Depressive Disorder   NATURE-THROID 65 MG tablet Generic drug:  thyroid Take 65 mg by mouth daily.  Indication:  Underactive Thyroid   nicotine 21 mg/24hr patch Commonly known as:  NICODERM CQ - dosed in mg/24 hours Place 1 patch (21 mg total) onto the skin daily. Start taking on:  12/19/2017  Indication:  Nicotine Addiction   risperiDONE 1 MG tablet Commonly known as:  RISPERDAL Take 1 tablet (1 mg total) by mouth 2 (two)  times daily.  Indication:  Manic-Depression, Major Depressive Disorder      Follow-up Information    [facility].   Why:  Please call no later than 12/20/17, to hold appointment and pay $20 deposit, for 01/01/18 Screening Appointment. Hospital follow up appointment for Medication Management, 215pm on 01/02/18, with NP [facility]Cliff Harper Contact information: [address] [place] [address] [phone]           Follow-up recommendations:  Activity:  as tolerated Diet:  heart healthy   Comments:  Take all medications as prescribed. Keep all follow-up appointments as scheduled.  Do not consume alcohol or use illegal drugs while on prescription medications. Report any adverse effects from your medications to your primary care provider promptly.  In the event of recurrent symptoms or worsening symptoms, call 911, a crisis hotline, or go to the nearest emergency department for evaluation.   Signed: Oneta Rackanika N Lewis, NP 12/18/2017, 8:47 AM   Patient seen, Suicide Assessment Completed.  Disposition Plan Reviewed

## 2017-12-18 NOTE — Progress Notes (Signed)
Pt discharged to lobby. Pt was stable and appreciative at that time. All papers and prescriptions were given and valuables returned. Verbal understanding expressed. Denies SI/HI and A/VH. Pt given opportunity to express concerns and ask questions.  

## 2017-12-18 NOTE — Progress Notes (Signed)
DAR Note: Pt observed in the dayroom interacting with peers. Pt appears to be still a little confused. Pt at assessment denied any anxiety, depression, SI, HI, AVH or pain; "I know I fill much better today that when I came in." Pt was med compliant. All patient's questions and concerns addressed. Support, encouragement, and safe environment provide. Pt attended PM group meeting.

## 2017-12-18 NOTE — Progress Notes (Signed)
Recreation Therapy Notes  Date: 9.16.19 Time: 1000 Location: 500 Hall Dayroom  Group Topic: Wellness  Goal Area(s) Addresses:  Patient will define components of whole wellness. Patient will verbalize benefit of whole wellness.  Behavioral Response: Engaged  Intervention:  Exercise, Music  Activity: Exercise.  LRT lead the group through a series of stretches.  Each group member would then lead the group in an exercise of their choice.  Patients and LRT went through a number of exercises and dance moves to get the heart pumping and blood flowing.  Education: Wellness, Building control surveyorDischarge Planning.   Education Outcome: Acknowledges education/In group clarification offered/Needs additional education.   Clinical Observations/Feedback:  Pt was excited and ready to move.  Pt was singing along with some of the songs being played.  Pt even tried to learn some of he dance steps being taught by some of her peers.  Pt was social and engaged throughout activity.    Caroll RancherMarjette Carli Lefevers, LRT/CTRS      Caroll RancherLindsay, Partick Musselman A 12/18/2017 12:31 PM

## 2020-05-21 IMAGING — CT CT HEAD W/O CM
4 series · 15 of 47 positions shown, 17 images · non-contrast
Comparison: None

CLINICAL DATA: Altered level of consciousness.

EXAM:
CT HEAD WITHOUT CONTRAST
TECHNIQUE: Contiguous axial images were obtained from the base of the skull
through the vertex without intravenous contrast.

[Series 3: head without · axial · non-contrast · 0.49mm/px · z∈[-115,+5]mm · 7 of 34 slices shown, 9 images]
[im 5/34  brain]
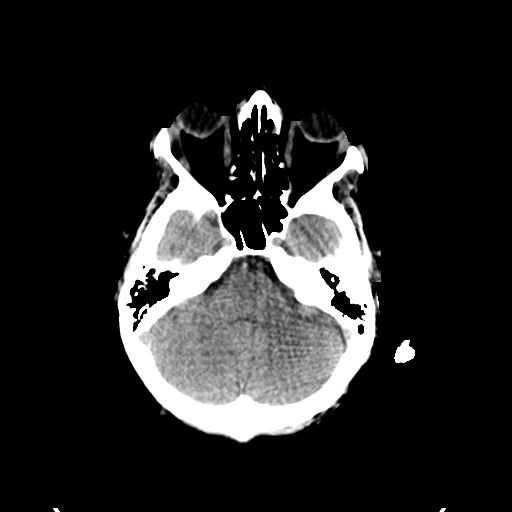
[im 5/34  bone]
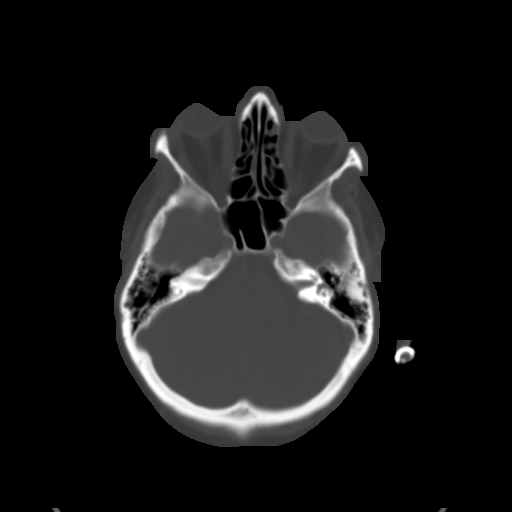
[im 9/34  brain]
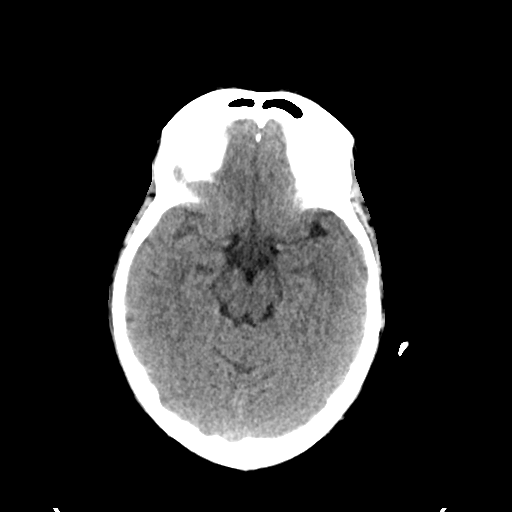
[im 13/34  brain]
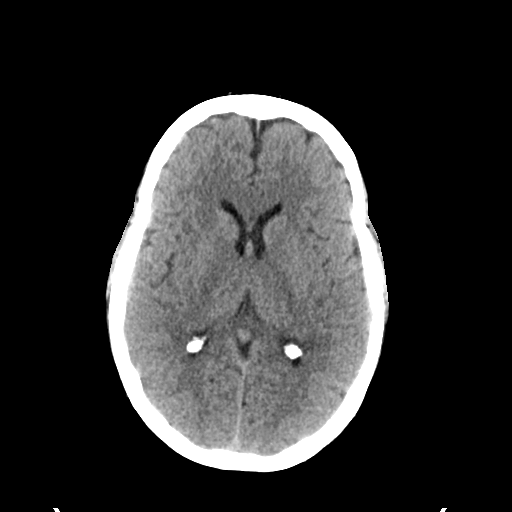
[im 17/34  brain]
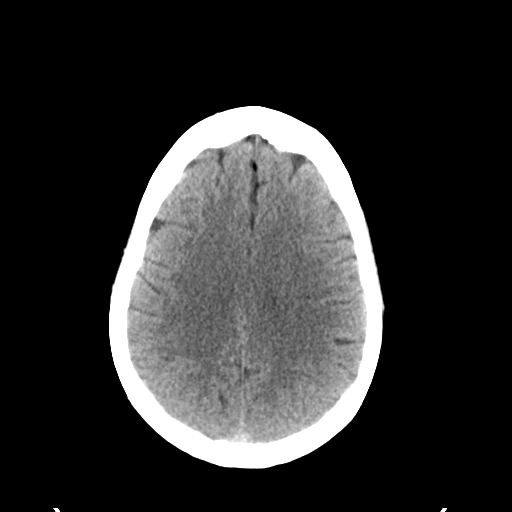
[im 21/34  brain]
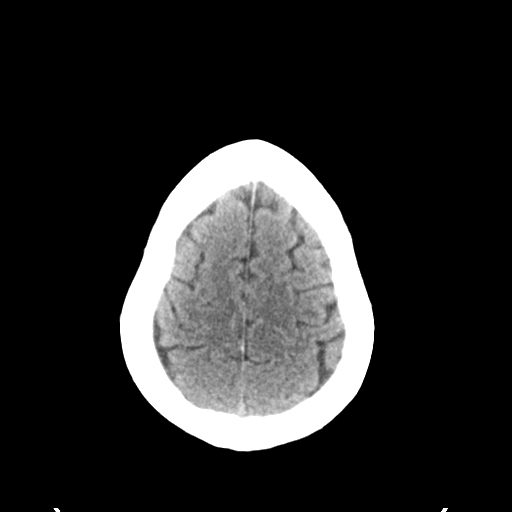
[im 21/34  bone]
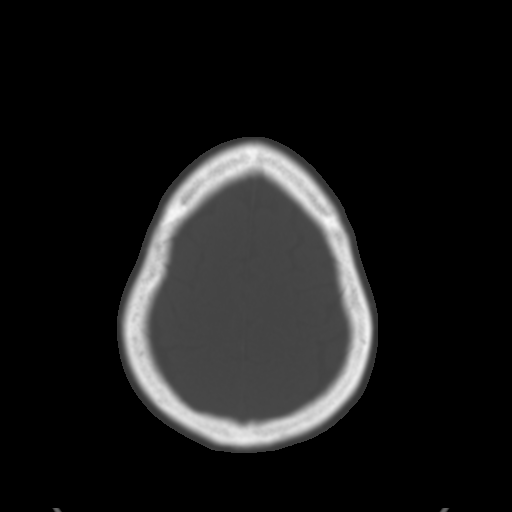
[im 25/34  brain]
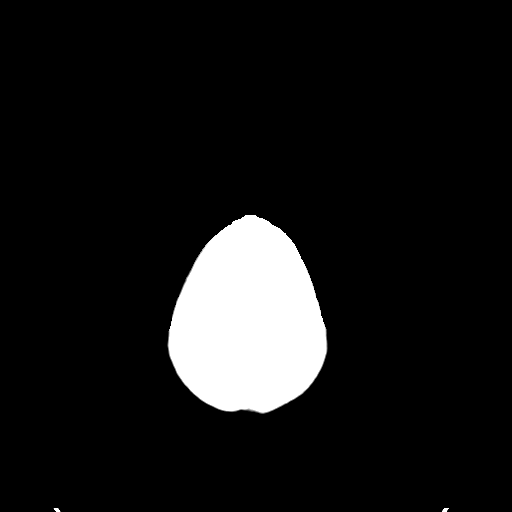
[im 29/34  brain]
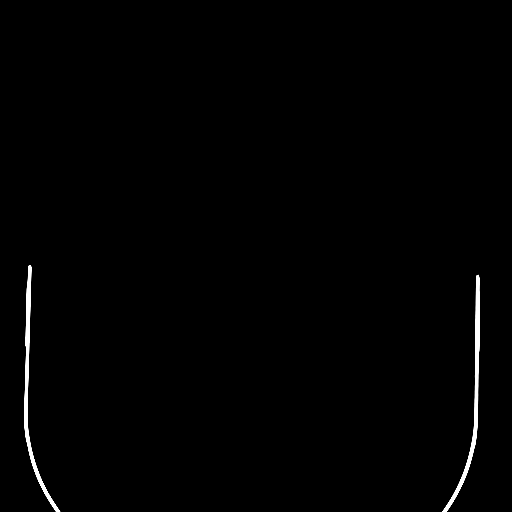

[Series 4: head bone · axial · 0.49mm/px · z∈[-119,-103]mm · 2 of 84 slices shown]
[im 9/84  bone]
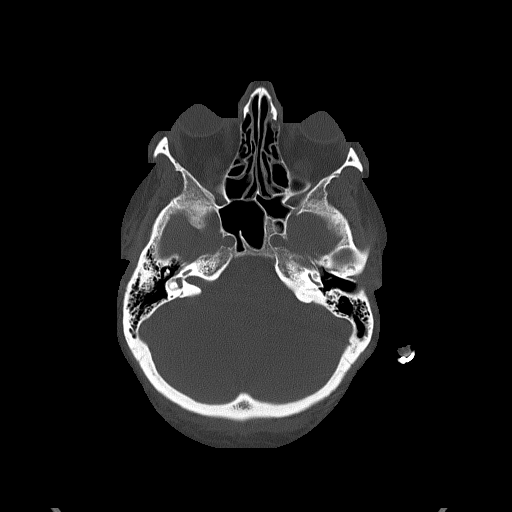
[im 17/84  bone]
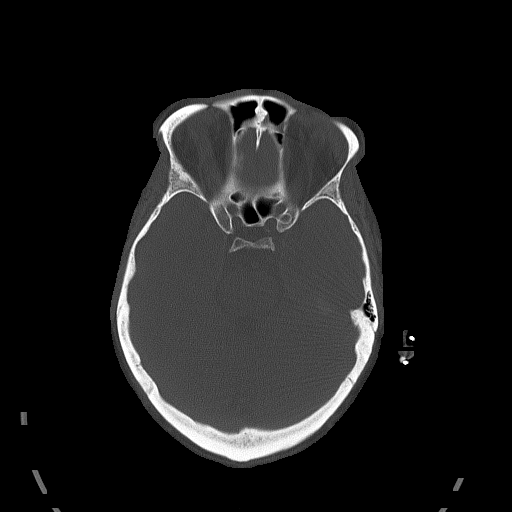

[Series 5: head without cor · coronal · non-contrast · 0.29mm/px · 3 of 73 slices shown]
[im 25/73  brain]
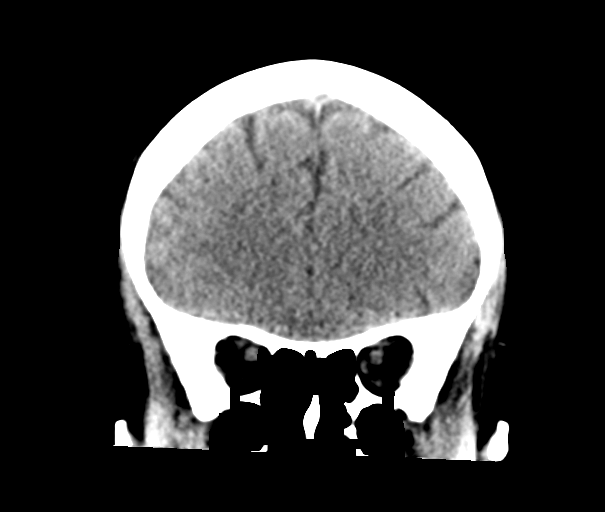
[im 33/73  brain]
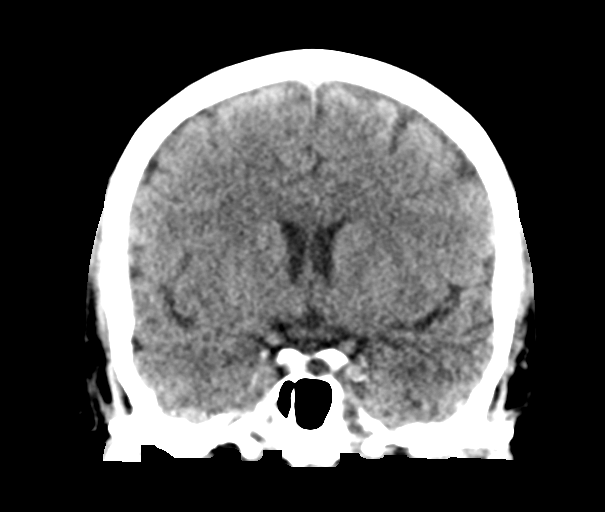
[im 41/73  brain]
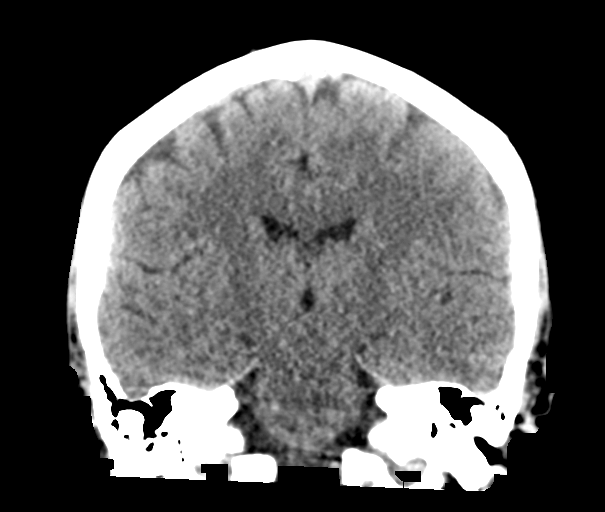

[Series 6: head without sag · sagittal · non-contrast · 0.34mm/px · 3 of 67 slices shown]
[im 23/67  brain]
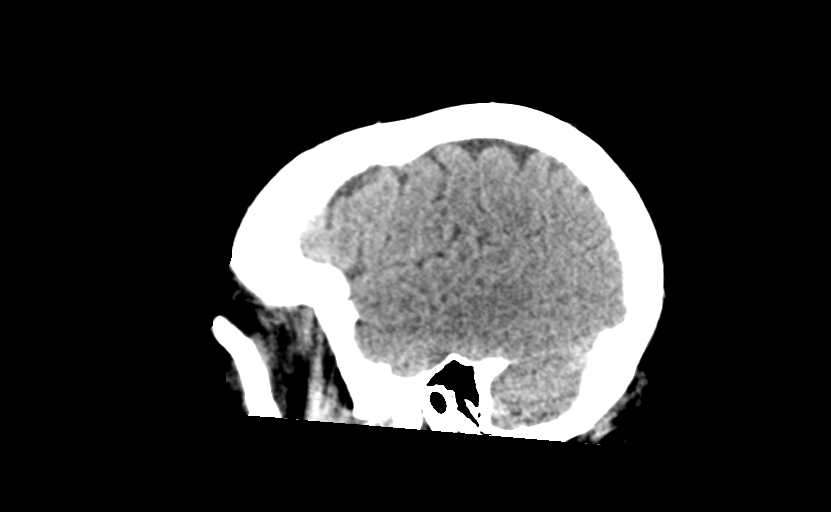
[im 34/67  brain]
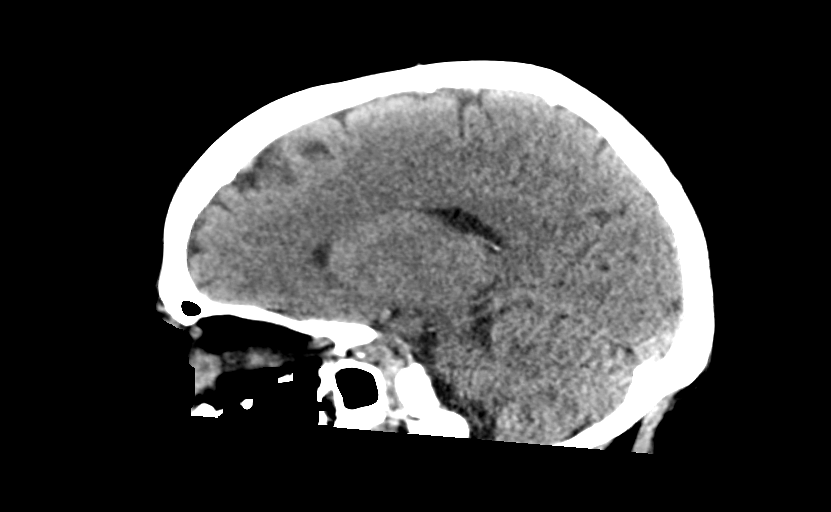
[im 45/67  brain]
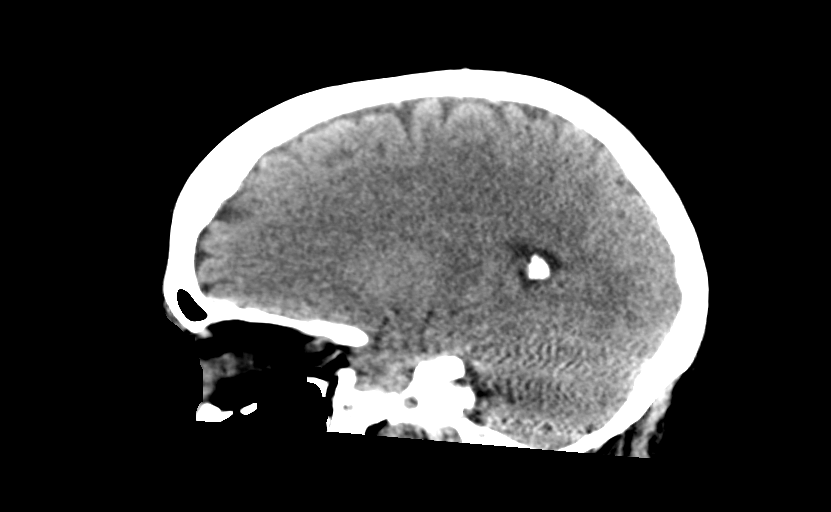

[15 of 47 positions shown; findings below may reference images not displayed]

FINDINGS: Brain: No acute intracranial abnormality. Specifically, no
hemorrhage, hydrocephalus, mass lesion, acute infarction, or
significant intracranial injury.

Vascular: No hyperdense vessel or unexpected calcification.

Skull: No acute calvarial abnormality.

Sinuses/Orbits: No acute findings

Other: None
IMPRESSION: Normal study.
